# Patient Record
Sex: Female | Born: 1955 | Race: White | Hispanic: No | Marital: Married | Smoking: Former smoker
Health system: Southern US, Community
[De-identification: ages and names within clinical notes are randomized; demographics above are authoritative.]

## PROBLEM LIST (undated history)

## (undated) DIAGNOSIS — M1990 Unspecified osteoarthritis, unspecified site: Secondary | ICD-10-CM

## (undated) DIAGNOSIS — E785 Hyperlipidemia, unspecified: Secondary | ICD-10-CM

## (undated) DIAGNOSIS — K219 Gastro-esophageal reflux disease without esophagitis: Secondary | ICD-10-CM

## (undated) DIAGNOSIS — G709 Myoneural disorder, unspecified: Secondary | ICD-10-CM

## (undated) DIAGNOSIS — E559 Vitamin D deficiency, unspecified: Secondary | ICD-10-CM

## (undated) DIAGNOSIS — B079 Viral wart, unspecified: Secondary | ICD-10-CM

## (undated) HISTORY — DX: Vitamin D deficiency, unspecified: E55.9

## (undated) HISTORY — DX: Viral wart, unspecified: B07.9

## (undated) HISTORY — DX: Unspecified osteoarthritis, unspecified site: M19.90

## (undated) HISTORY — DX: Gastro-esophageal reflux disease without esophagitis: K21.9

## (undated) HISTORY — DX: Myoneural disorder, unspecified: G70.9

## (undated) HISTORY — DX: Hyperlipidemia, unspecified: E78.5

## (undated) HISTORY — PX: SHOULDER SURGERY: SHX246

---
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
## 1988-02-13 HISTORY — PX: CHOLECYSTECTOMY: SHX55

## 1988-02-13 HISTORY — PX: APPENDECTOMY: SHX54

## 2008-12-04 ENCOUNTER — Ambulatory Visit: Payer: Self-pay | Admitting: Pulmonary Disease

## 2008-12-04 DIAGNOSIS — R059 Cough, unspecified: Secondary | ICD-10-CM | POA: Insufficient documentation

## 2008-12-04 DIAGNOSIS — R05 Cough: Secondary | ICD-10-CM

## 2013-02-26 ENCOUNTER — Encounter: Payer: Self-pay | Admitting: Family Medicine

## 2013-02-26 DIAGNOSIS — M1711 Unilateral primary osteoarthritis, right knee: Secondary | ICD-10-CM | POA: Insufficient documentation

## 2013-02-26 DIAGNOSIS — E559 Vitamin D deficiency, unspecified: Secondary | ICD-10-CM | POA: Insufficient documentation

## 2013-02-26 DIAGNOSIS — E785 Hyperlipidemia, unspecified: Secondary | ICD-10-CM | POA: Insufficient documentation

## 2013-03-25 ENCOUNTER — Encounter: Payer: Self-pay | Admitting: Family Medicine

## 2013-04-20 ENCOUNTER — Encounter: Payer: Self-pay | Admitting: General Practice

## 2013-04-20 ENCOUNTER — Telehealth: Payer: Self-pay | Admitting: Family Medicine

## 2013-04-20 ENCOUNTER — Encounter: Payer: Self-pay | Admitting: *Deleted

## 2013-04-20 ENCOUNTER — Ambulatory Visit (INDEPENDENT_AMBULATORY_CARE_PROVIDER_SITE_OTHER): Payer: BC Managed Care – PPO | Admitting: General Practice

## 2013-04-20 VITALS — BP 114/67 | HR 69 | Temp 97.0°F | Ht 68.0 in | Wt 174.0 lb

## 2013-04-20 DIAGNOSIS — R350 Frequency of micturition: Secondary | ICD-10-CM

## 2013-04-20 DIAGNOSIS — N390 Urinary tract infection, site not specified: Secondary | ICD-10-CM

## 2013-04-20 LAB — POCT UA - MICROSCOPIC ONLY: WBC, Ur, HPF, POC: NEGATIVE

## 2013-04-20 LAB — POCT URINALYSIS DIPSTICK
Bilirubin, UA: NEGATIVE
Glucose, UA: NEGATIVE
Ketones, UA: NEGATIVE
Spec Grav, UA: 1.02
Urobilinogen, UA: NEGATIVE

## 2013-04-20 MED ORDER — CIPROFLOXACIN HCL 500 MG PO TABS: 500.0000 mg | ORAL_TABLET | Freq: Two times a day (BID) | ORAL | Status: DC

## 2013-04-20 NOTE — Progress Notes (Signed)
  Subjective:    Patient ID: Lori Livingston, female    DOB: [birth year], 57 y.o.   MRN: [id number]  HPI Presents today with frequency, urgency of urination. Denies burning with urination. Reports low back pain. OTC medications not taken.     Review of Systems  Constitutional: Negative for fever and chills.  Respiratory: Negative for chest tightness and shortness of breath.   Cardiovascular: Negative for chest pain.  Genitourinary: Negative for dysuria, hematuria and difficulty urinating.  Musculoskeletal: Positive for back pain.  Neurological: Negative for dizziness and headaches.  Psychiatric/Behavioral: Negative.        Objective:   Physical Exam  Constitutional: She is oriented to person, place, and time. She appears well-developed and well-nourished.  Cardiovascular: Normal rate, regular rhythm and normal heart sounds.   Pulmonary/Chest: Effort normal and breath sounds normal. No respiratory distress. She exhibits no tenderness.  Neurological: She is alert and oriented to person, place, and time.  Skin: Skin is warm and dry.  Psychiatric: She has a normal mood and affect.   Results for orders placed in visit on 04/20/13  POCT URINALYSIS DIPSTICK      Result Value Range   Color, UA yellow     Clarity, UA clear     Glucose, UA neg     Bilirubin, UA neg     Ketones, UA neg     Spec Grav, UA 1.020     Blood, UA trace     pH, UA 6.5     Protein, UA trace     Urobilinogen, UA negative     Nitrite, UA neg     Leukocytes, UA Negative    POCT UA - MICROSCOPIC ONLY      Result Value Range   WBC, Ur, HPF, POC neg     RBC, urine, microscopic 5-10     Bacteria, U Microscopic mod     Mucus, UA mod     Epithelial cells, urine per micros few     Crystals, Ur, HPF, POC neg     Casts, Ur, LPF, POC neg     Yeast, UA neg           Assessment & Plan:  Take antibiotics until course completed Increase fluid intake Proper perineal care Increase fluid intake AZO over the  counter X2 days Frequent voiding Proper perineal hygiene Culture pending Patient verbalized understanding Coralie Keens, FNP-C

## 2013-04-20 NOTE — Telephone Encounter (Signed)
APPT GIVEN FOR TONIGHT

## 2013-04-20 NOTE — Patient Instructions (Signed)
Urinary Tract Infection Urinary tract infections (UTIs) can develop anywhere along your urinary tract. Your urinary tract is your body's drainage system for removing wastes and extra water. Your urinary tract includes two kidneys, two ureters, a bladder, and a urethra. Your kidneys are a pair of bean-shaped organs. Each kidney is about the size of your fist. They are located below your ribs, one on each side of your spine. CAUSES Infections are caused by microbes, which are microscopic organisms, including fungi, viruses, and bacteria. These organisms are so small that they can only be seen through a microscope. Bacteria are the microbes that most commonly cause UTIs. SYMPTOMS  Symptoms of UTIs may vary by age and gender of the patient and by the location of the infection. Symptoms in young women typically include a frequent and intense urge to urinate and a painful, burning feeling in the bladder or urethra during urination. Older women and men are more likely to be tired, shaky, and weak and have muscle aches and abdominal pain. A fever may mean the infection is in your kidneys. Other symptoms of a kidney infection include pain in your back or sides below the ribs, nausea, and vomiting. DIAGNOSIS To diagnose a UTI, your caregiver will ask you about your symptoms. Your caregiver also will ask to provide a urine sample. The urine sample will be tested for bacteria and white blood cells. White blood cells are made by your body to help fight infection. TREATMENT  Typically, UTIs can be treated with medication. Because most UTIs are caused by a bacterial infection, they usually can be treated with the use of antibiotics. The choice of antibiotic and length of treatment depend on your symptoms and the type of bacteria causing your infection. HOME CARE INSTRUCTIONS  If you were prescribed antibiotics, take them exactly as your caregiver instructs you. Finish the medication even if you feel better after you  have only taken some of the medication.  Drink enough water and fluids to keep your urine clear or pale yellow.  Avoid caffeine, tea, and carbonated beverages. They tend to irritate your bladder.  Empty your bladder often. Avoid holding urine for long periods of time.  Empty your bladder before and after sexual intercourse.  After a bowel movement, women should cleanse from front to back. Use each tissue only once. SEEK MEDICAL CARE IF:   You have back pain.  You develop a fever.  Your symptoms do not begin to resolve within 3 days. SEEK IMMEDIATE MEDICAL CARE IF:   You have severe back pain or lower abdominal pain.  You develop chills.  You have nausea or vomiting.  You have continued burning or discomfort with urination. MAKE SURE YOU:   Understand these instructions.  Will watch your condition.  Will get help right away if you are not doing well or get worse. Document Released: 09/10/2005 Document Revised: 06/01/2012 Document Reviewed: 01/09/2012 [employer] Patient Information 2013 [employer].  

## 2013-05-27 ENCOUNTER — Ambulatory Visit: Payer: Self-pay | Admitting: Family Medicine

## 2013-06-23 ENCOUNTER — Ambulatory Visit: Payer: Self-pay | Admitting: Family Medicine

## 2013-07-06 ENCOUNTER — Ambulatory Visit (INDEPENDENT_AMBULATORY_CARE_PROVIDER_SITE_OTHER): Payer: BC Managed Care – PPO | Admitting: Family Medicine

## 2013-07-06 ENCOUNTER — Encounter: Payer: Self-pay | Admitting: Family Medicine

## 2013-07-06 VITALS — BP 131/76 | HR 75 | Temp 97.5°F | Ht 67.5 in | Wt 172.0 lb

## 2013-07-06 DIAGNOSIS — M549 Dorsalgia, unspecified: Secondary | ICD-10-CM | POA: Insufficient documentation

## 2013-07-06 MED ORDER — TRAMADOL HCL 50 MG PO TABS: 50.0000 mg | ORAL_TABLET | Freq: Three times a day (TID) | ORAL | Status: DC | PRN

## 2013-07-06 MED ORDER — DICLOFENAC SODIUM 1 % TD GEL: 2.0000 g | Freq: Four times a day (QID) | TRANSDERMAL | Status: DC | PRN

## 2013-07-06 MED ORDER — CYCLOBENZAPRINE HCL 5 MG PO TABS: 5.0000 mg | ORAL_TABLET | Freq: Three times a day (TID) | ORAL | Status: DC | PRN

## 2013-07-06 NOTE — Progress Notes (Signed)
  Subjective:    Patient ID: Lori Livingston, female    DOB: 04/09/1956, 57 y.o.   MRN: [id number]  Back Pain Pertinent negatives include no abdominal pain, chest pain, dysuria or fever.   Mild back pain started after working in garden. "felt a twinge on L". Pain became considerably worse after getting up from comode at restaruant later that night. Pain now located on R lower back. Heat, Ice, and tylenol w/o much benefit. Wakes up at night. Denies radiation. Worse w/ certain movements. Denies any loss of bowel or bladder function or change in sensation. Denies any h/o prevoius trauma.  Meloxiam 15mg  daily w/ some relief. Takes this for Osteoarthritis.    Review of Systems  Constitutional: Negative for fever and activity change.  Respiratory: Negative for shortness of breath.   Cardiovascular: Negative for chest pain and palpitations.  Gastrointestinal: Negative for abdominal pain and abdominal distention.  Genitourinary: Negative for dysuria, frequency and flank pain.  Musculoskeletal: Positive for back pain.  All other systems reviewed and are negative.       Objective:   Physical Exam  Vitals reviewed. Constitutional: She is oriented to person, place, and time. She appears well-developed and well-nourished. No distress.  HENT:  Head: Normocephalic and atraumatic.  Eyes: Conjunctivae are normal. Pupils are equal, round, and reactive to light.  Neck: Normal range of motion. Neck supple.  Cardiovascular: Normal rate, normal heart sounds and intact distal pulses.   No murmur heard. Pulmonary/Chest: Effort normal and breath sounds normal.  Abdominal: Soft. She exhibits no distension.  Musculoskeletal:  ROM of hip limited secondary to pain. Straight let raise of R leg to 75 degrees, L leg to 90.  FABERs positive on R. 4/5 strength bilat on leg lift. Painful to palpation of lumbar spine in the R perispinal regionand along the superior SI joint area. No point tenderness and no swelling or  ecchymosis.   Neurological: She is alert and oriented to person, place, and time.  Skin: Skin is warm and dry. No rash noted. She is not diaphoretic. No erythema.  Psychiatric: She has a normal mood and affect. Her behavior is normal.  BP 131/76  Pulse 75  Temp(Src) 97.5 F (36.4 C) (Oral)  Ht 5' 7.5" (1.715 m)  Wt 172 lb (78.019 kg)  BMI 26.53 kg/m2         Assessment & Plan:  57 yo female w/ lumbar strain and SI joint irritation. - cont Meloxicam - continue heat therapy - start flexeril - start tramadol PRN - start voltaren gel  - exercises demonstrated - work note proveded.  - precautions given and all questions answered  Shelly Flatten, MD Family Medicine PGY-3 07/06/2013, 11:53 AM

## 2013-07-06 NOTE — Assessment & Plan Note (Signed)
lumbar strain and SI joint irritation. - cont Meloxicam - continue heat therapy - start flexeril - start tramadol PRN - start voltaren gel  - exercises demonstrated - work note proveded.  - precautions given and all questions answered

## 2013-07-06 NOTE — Patient Instructions (Addendum)
Thank you for coming in today You are experiencing lumbar strain with muscle spasm and irritation to your sacroiliac joint Please continue taking the meloxicam Start applying heat only to your back (no more cold) Rest when able but stay active, jut try not to over due it Please start using the flexeril and voltaren gel for relief Use the tramadol only as needed after the other therapies have been attempted This should resolve with in a few days to weeks Have a great day. Follow up in our office as needed.  Muscle Cramps Muscle cramps are due to sudden involuntary muscle contraction. This means you have no control over the tightening of a muscle (or muscles). Often there are no obvious causes. Muscle cramps may occur with overexertion. They may also occur with chilling of the muscles. An example of a muscle chilling activity is swimming. It is uncommon for cramps to be due to a serious underlying disorder. In most cases, muscle cramps improve (or leave) within minutes. CAUSES  Some common causes are:  Injury.  Infections, especially viral.  Abnormal levels of the salts and ions in your blood (electrolytes). This could happen if you are taking water pills (diuretics).  Blood vessel disease where not enough blood is getting to the muscles (intermittent claudication). Some uncommon causes are:  Side effects of some medicine (such as lithium).  Alcohol abuse.  Diseases where there is soreness (inflammation) of the muscular system. HOME CARE INSTRUCTIONS   It may be helpful to massage, stretch, and relax the affected muscle.  Taking a dose of over-the-counter diphenhydramine is helpful for night leg cramps. SEEK MEDICAL CARE IF:  Cramps are frequent and not relieved with medicine. MAKE SURE YOU:   Understand these instructions.  Will watch your condition.  Will get help right away if you are not doing well or get worse. Document Released: 05/23/2002 Document Revised: 02/23/2012  Document Reviewed: 11/22/2008 [facility] Patient Information 2014 [place], [place].

## 2013-08-11 ENCOUNTER — Ambulatory Visit: Payer: Self-pay | Admitting: Family Medicine

## 2013-09-02 ENCOUNTER — Encounter: Payer: Self-pay | Admitting: Family Medicine

## 2013-09-02 ENCOUNTER — Ambulatory Visit (INDEPENDENT_AMBULATORY_CARE_PROVIDER_SITE_OTHER): Payer: BC Managed Care – PPO | Admitting: Family Medicine

## 2013-09-02 VITALS — BP 123/66 | HR 62 | Temp 97.0°F | Ht 67.0 in | Wt 175.8 lb

## 2013-09-02 DIAGNOSIS — M1711 Unilateral primary osteoarthritis, right knee: Secondary | ICD-10-CM

## 2013-09-02 DIAGNOSIS — E559 Vitamin D deficiency, unspecified: Secondary | ICD-10-CM

## 2013-09-02 DIAGNOSIS — E785 Hyperlipidemia, unspecified: Secondary | ICD-10-CM

## 2013-09-02 DIAGNOSIS — M1710 Unilateral primary osteoarthritis, unspecified knee: Secondary | ICD-10-CM

## 2013-09-02 MED ORDER — PRAVASTATIN SODIUM 40 MG PO TABS: 40.0000 mg | ORAL_TABLET | Freq: Every day | ORAL | Status: DC

## 2013-09-02 NOTE — Patient Instructions (Addendum)
      Dr Jimmie Dattilio's Recommendations  For nutrition information, I recommend books:  1).Eat to Live by Dr Joel Fuhrman. 2).Prevent and Reverse Heart Disease by Dr Caldwell Esselstyn. 3) Dr Neal Barnard's Book:  Program to Reverse Diabetes  Exercise recommendations are:  If unable to walk, then the patient can exercise in a chair 3 times a day. By flapping arms like a bird gently and raising legs outwards to the front.  If ambulatory, the patient can go for walks for 30 minutes 3 times a week. Then increase the intensity and duration as tolerated.  Goal is to try to attain exercise frequency to 5 times a week.  If applicable: Best to perform resistance exercises (machines or weights) 2 days a week and cardio type exercises 3 days per week.  

## 2013-09-02 NOTE — Progress Notes (Signed)
Patient ID: Lori Livingston, female   DOB: May 03, 1956, 57 y.o.   MRN: [id number] SUBJECTIVE: CC: Chief Complaint  Patient presents with  . Follow-up    3 month discuss labs  wants written rx for pravastatain    HPI: Brings her lab results from [facility].   Patient is here for follow up of hyperlipidemia/vit D Def: denies Headache;denies Chest Pain;denies weakness;denies Shortness of Breath and orthopnea;denies Visual changes;denies palpitations;denies cough;denies pedal edema;denies symptoms of TIA or stroke;deniesClaudication symptoms. admits to Compliance with medications; denies Problems with medications.  Right knee arthritis ongoing.  Dietary changes have been modest.  Past Medical History  Diagnosis Date  . Neuromuscular disorder     Right knee meniscus tear  . Arthritis     Right knee  . Hyperlipidemia   . Warts   . Vitamin D deficiency    Past Surgical History  Procedure Laterality Date  . Tonsillectomy  1964  . Cholecystectomy  02/1988  . Appendectomy  02/1988   History   Social History  . Marital Status: Married    Spouse Name: N/A    Number of Children: N/A  . Years of Education: N/A   Occupational History  . Not on file.   Social History Main Topics  . Smoking status: Former Smoker    Types: Cigarettes    Quit date: 07/06/1978  . Smokeless tobacco: Never Used  . Alcohol Use: No  . Drug Use: No  . Sexual Activity: Not on file   Other Topics Concern  . Not on file   Social History Narrative  . No narrative on file   Family History  Problem Relation Age of Onset  . Heart disease Mother   . Diabetes Mother   . Heart attack Mother 33  . Stroke Father   . Cancer Brother     melanoma   Current Outpatient Prescriptions on File Prior to Visit  Medication Sig Dispense Refill  . Coenzyme Q10 200 MG capsule Take 200 mg by mouth daily.      . Cyanocobalamin (VITAMIN B-12 PO) Take by mouth.      . diclofenac sodium (VOLTAREN) 1 % GEL Apply 2 g  topically 4 (four) times daily as needed.  100 g  1  . Ergocalciferol (VITAMIN D2) 2000 UNITS TABS Take 2,000 Int'l Units by mouth daily.      Marland Kitchen estradiol (ESTRACE) 1 MG tablet Take 1 mg by mouth daily.      . meloxicam (MOBIC) 15 MG tablet Take 15 mg by mouth daily.      . Multiple Vitamin (MULTIVITAMIN) tablet Take 1 tablet by mouth daily.       No current facility-administered medications on file prior to visit.   Allergies  Allergen Reactions  . Celebrex [Celecoxib]   . Indomethacin   . Propoxyphene-Acetaminophen    Immunization History  Administered Date(s) Administered  . Influenza Whole 09/28/2012   Prior to Admission medications   Medication Sig Start Date End Date Taking? Authorizing Provider  Coenzyme Q10 200 MG capsule Take 200 mg by mouth daily.   Yes Historical Provider, MD  Cyanocobalamin (VITAMIN B-12 PO) Take by mouth.   Yes Historical Provider, MD  diclofenac sodium (VOLTAREN) 1 % GEL Apply 2 g topically 4 (four) times daily as needed. 07/06/13  Yes Ozella Rocks, MD  Ergocalciferol (VITAMIN D2) 2000 UNITS TABS Take 2,000 Int'l Units by mouth daily.   Yes Historical Provider, MD  estradiol (ESTRACE) 1 MG tablet Take 1  mg by mouth daily.   Yes Historical Provider, MD  meloxicam (MOBIC) 15 MG tablet Take 15 mg by mouth daily.   Yes Historical Provider, MD  Multiple Vitamin (MULTIVITAMIN) tablet Take 1 tablet by mouth daily.   Yes Historical Provider, MD  pravastatin (PRAVACHOL) 40 MG tablet Take 1 tablet (40 mg total) by mouth daily. 09/02/13  Yes Ileana Ladd, MD     ROS: As above in the HPI. All other systems are stable or negative.  OBJECTIVE: APPEARANCE:  Patient in no acute distress.The patient appeared well nourished and normally developed. Acyanotic. Waist: VITAL SIGNS:BP 123/66  Pulse 62  Temp(Src) 97 F (36.1 C) (Oral)  Ht 5\' 7"  (1.702 m)  Wt 175 lb 12.8 oz (79.742 kg)  BMI 27.53 kg/m2 WF  SKIN: warm and  Dry without overt rashes, tattoos and  scars  HEAD and Neck: without JVD, Head and scalp: normal Eyes:No scleral icterus. Fundi normal, eye movements normal. Ears: Auricle normal, canal normal, Tympanic membranes normal, insufflation normal. Nose: normal Throat: normal Neck & thyroid: normal  CHEST & LUNGS: Chest wall: normal Lungs: Clear  CVS: Reveals the PMI to be normally located. Regular rhythm, First and Second Heart sounds are normal,  absence of murmurs, rubs or gallops. Peripheral vasculature: Radial pulses: normal Dorsal pedis pulses: normal Posterior pulses: normal  ABDOMEN:  Appearance: normal Benign, no organomegaly, no masses, no Abdominal Aortic enlargement. No Guarding , no rebound. No Bruits. Bowel sounds: normal  RECTAL: N/A GU: N/A  EXTREMETIES: nonedematous.  MUSCULOSKELETAL:  Spine: normal Joints: right knee crepitus.  NEUROLOGIC: oriented to time,place and person; nonfocal. Strength is normal Sensory is normal Reflexes are normal Cranial Nerves are normal.  ASSESSMENT: Other and unspecified hyperlipidemia - Plan: pravastatin (PRAVACHOL) 40 MG tablet  Arthritis of knee, right  Unspecified vitamin D deficiency   PLAN: Labs reviewed and to be scanned into EPIC. Results are acceptable. But could be a little better and hit exact target with aggressive diet.  Meds ordered this encounter  Medications  . pravastatin (PRAVACHOL) 40 MG tablet    Sig: Take 1 tablet (40 mg total) by mouth daily.    Dispense:  90 tablet    Refill:  3    Same regimen.       Dr Woodroe Mode Recommendations  For nutrition information, I recommend books:  1).Eat to Live by Dr Monico Hoar. 2).Prevent and Reverse Heart Disease by Dr Suzzette Righter. 3) Dr Katherina Right Book:  Program to Reverse Diabetes  Exercise recommendations are:  If unable to walk, then the patient can exercise in a chair 3 times a day. By flapping arms like a bird gently and raising legs outwards to the  front.  If ambulatory, the patient can go for walks for 30 minutes 3 times a week. Then increase the intensity and duration as tolerated.  Goal is to try to attain exercise frequency to 5 times a week.  If applicable: Best to perform resistance exercises (machines or weights) 2 days a week and cardio type exercises 3 days per week.  Return in about 6 months (around 03/02/2014) for Recheck medical problems.  Lockie Bothun P. [place] Charon, M.D.

## 2014-02-11 ENCOUNTER — Other Ambulatory Visit: Payer: Self-pay | Admitting: Nurse Practitioner

## 2014-02-14 NOTE — Telephone Encounter (Signed)
Last seen 09/02/13  [facility]  No lipids in EPIC

## 2014-02-14 NOTE — Telephone Encounter (Signed)
Patient needs to be seen. Has exceeded time since last visit. Limited quantity refilled. Needs to bring all medications to next appointment.   

## 2014-04-11 ENCOUNTER — Other Ambulatory Visit: Payer: Self-pay | Admitting: Family Medicine

## 2014-04-12 NOTE — Telephone Encounter (Signed)
No lipids in Epic. Past due for labs. If you approve print rx for pt to pickup.

## 2014-04-12 NOTE — Telephone Encounter (Signed)
Patient needs to be seen. Patient has exceeded limit since last visit. Refill denied. Bring all medications at next office visit. 

## 2014-04-17 ENCOUNTER — Ambulatory Visit (INDEPENDENT_AMBULATORY_CARE_PROVIDER_SITE_OTHER): Payer: BC Managed Care – PPO | Admitting: Family

## 2014-04-17 ENCOUNTER — Encounter: Payer: Self-pay | Admitting: Family

## 2014-04-17 VITALS — BP 122/67 | HR 91 | Temp 97.5°F | Ht 68.0 in | Wt 180.0 lb

## 2014-04-17 DIAGNOSIS — J019 Acute sinusitis, unspecified: Secondary | ICD-10-CM

## 2014-04-17 MED ORDER — AMOXICILLIN-POT CLAVULANATE 875-125 MG PO TABS: 1.0000 | ORAL_TABLET | Freq: Two times a day (BID) | ORAL | Status: DC

## 2014-04-17 MED ORDER — BENZONATATE 200 MG PO CAPS: 200.0000 mg | ORAL_CAPSULE | Freq: Three times a day (TID) | ORAL | Status: DC | PRN

## 2014-04-17 NOTE — Progress Notes (Signed)
Subjective:    Patient ID: Lori Livingston, female    DOB: July 02, 1956, 58 y.o.   MRN: [id number]  Cough This is a new problem. The current episode started 1 to 4 weeks ago. The problem has been gradually worsening. The problem occurs every few minutes. The cough is productive of purulent sputum. Associated symptoms include ear congestion, headaches, nasal congestion, postnasal drip, rhinorrhea, a sore throat and shortness of breath. Pertinent negatives include no chills, ear pain, fever or wheezing. The symptoms are aggravated by lying down. She has tried body position changes (Mucinex) for the symptoms. The treatment provided mild relief. There is no history of asthma or COPD.      Review of Systems  Constitutional: Negative for fever and chills.  HENT: Positive for postnasal drip, rhinorrhea and sore throat. Negative for ear pain.   Respiratory: Positive for cough and shortness of breath. Negative for wheezing.   Neurological: Positive for headaches.  All other systems reviewed and are negative.      Objective:   Physical Exam  Vitals reviewed. Constitutional: She is oriented to person, place, and time. She appears well-developed and well-nourished. No distress.  HENT:  Head: Normocephalic and atraumatic.  Right Ear: External ear normal.  Left Ear: External ear normal.  Mouth/Throat: No oropharyngeal exudate.  Nasal passage erythemas with mild swelling    Eyes: Pupils are equal, round, and reactive to light.  Neck: Normal range of motion. Neck supple. No thyromegaly present.  Cardiovascular: Normal rate, regular rhythm, normal heart sounds and intact distal pulses.   No murmur heard. Pulmonary/Chest: Effort normal and breath sounds normal. No respiratory distress. She has no wheezes.  Dry non-productive cough   Abdominal: Soft. Bowel sounds are normal. She exhibits no distension. There is no tenderness.  Musculoskeletal: Normal range of motion. She exhibits no edema and no  tenderness.  Neurological: She is alert and oriented to person, place, and time.  Skin: Skin is warm and dry.  Psychiatric: She has a normal mood and affect. Her behavior is normal. Judgment and thought content normal.   BP 122/67  Pulse 91  Temp(Src) 97.5 F (36.4 C) (Oral)  Ht 5\' 8"  (1.727 m)  Wt 180 lb (81.647 kg)  BMI 27.38 kg/m2        Assessment & Plan:  1. Sinusitis, acute - Take meds as prescribed - Use a cool mist humidifier  -Use saline nose sprays frequently -Saline irrigations of the nose can be very helpful if done frequently.  * 4X daily for 1 week*  * Use of a nettie pot can be helpful with this. Follow directions with this* -Force fluids -For any cough or congestion  Use plain Mucinex- regular strength or max strength is fine   * Children- consult with Pharmacist for dosing -For fever or aces or pains- take tylenol or ibuprofen appropriate for age and weight.  * for fevers greater than 101 orally you may alternate ibuprofen and tylenol every  3 hours. -Throat lozenges if help -Daily Claritin for next several months  Meds ordered this encounter  Medications  . amoxicillin-clavulanate (AUGMENTIN) 875-125 MG per tablet    Sig: Take 1 tablet by mouth 2 (two) times daily.    Dispense:  10 tablet    Refill:  0    Order Specific Question:  Supervising Provider    Answer:  Chipper Herb [[id number]]  . benzonatate (TESSALON) 200 MG capsule    Sig: Take 1 capsule (200 mg total) by  mouth 3 (three) times daily as needed for cough.    Dispense:  20 capsule    Refill:  0    Order Specific Question:  Supervising Provider    Answer:  Chipper Herb [[id number]]     Evelina Dun, FNP

## 2014-04-17 NOTE — Patient Instructions (Signed)
Sinusitis Sinusitis is redness, soreness, and swelling (inflammation) of the paranasal sinuses. Paranasal sinuses are air pockets within the bones of your face (beneath the eyes, the middle of the forehead, or above the eyes). In healthy paranasal sinuses, mucus is able to drain out, and air is able to circulate through them by way of your nose. However, when your paranasal sinuses are inflamed, mucus and air can become trapped. This can allow bacteria and other germs to grow and cause infection. Sinusitis can develop quickly and last only a short time (acute) or continue over a long period (chronic). Sinusitis that lasts for more than 12 weeks is considered chronic.  CAUSES  Causes of sinusitis include:  Allergies.  Structural abnormalities, such as displacement of the cartilage that separates your nostrils (deviated septum), which can decrease the air flow through your nose and sinuses and affect sinus drainage.  Functional abnormalities, such as when the small hairs (cilia) that line your sinuses and help remove mucus do not work properly or are not present. SYMPTOMS  Symptoms of acute and chronic sinusitis are the same. The primary symptoms are pain and pressure around the affected sinuses. Other symptoms include:  Upper toothache.  Earache.  Headache.  Bad breath.  Decreased sense of smell and taste.  A cough, which worsens when you are lying flat.  Fatigue.  Fever.  Thick drainage from your nose, which often is green and may contain pus (purulent).  Swelling and warmth over the affected sinuses. DIAGNOSIS  Your caregiver will perform a physical exam. During the exam, your caregiver may:  Look in your nose for signs of abnormal growths in your nostrils (nasal polyps).  Tap over the affected sinus to check for signs of infection.  View the inside of your sinuses (endoscopy) with a special imaging device with a light attached (endoscope), which is inserted into your  sinuses. If your caregiver suspects that you have chronic sinusitis, one or more of the following tests may be recommended:  Allergy tests.  Nasal culture A sample of mucus is taken from your nose and sent to a lab and screened for bacteria.  Nasal cytology A sample of mucus is taken from your nose and examined by your caregiver to determine if your sinusitis is related to an allergy. TREATMENT  Most cases of acute sinusitis are related to a viral infection and will resolve on their own within 10 days. Sometimes medicines are prescribed to help relieve symptoms (pain medicine, decongestants, nasal steroid sprays, or saline sprays).  However, for sinusitis related to a bacterial infection, your caregiver will prescribe antibiotic medicines. These are medicines that will help kill the bacteria causing the infection.  Rarely, sinusitis is caused by a fungal infection. In theses cases, your caregiver will prescribe antifungal medicine. For some cases of chronic sinusitis, surgery is needed. Generally, these are cases in which sinusitis recurs more than 3 times per year, despite other treatments. HOME CARE INSTRUCTIONS   Drink plenty of water. Water helps thin the mucus so your sinuses can drain more easily.  Use a humidifier.  Inhale steam 3 to 4 times a day (for example, sit in the bathroom with the shower running).  Apply a warm, moist washcloth to your face 3 to 4 times a day, or as directed by your caregiver.  Use saline nasal sprays to help moisten and clean your sinuses.  Take over-the-counter or prescription medicines for pain, discomfort, or fever only as directed by your caregiver. SEEK IMMEDIATE MEDICAL   CARE IF:  You have increasing pain or severe headaches.  You have nausea, vomiting, or drowsiness.  You have swelling around your face.  You have vision problems.  You have a stiff neck.  You have difficulty breathing. MAKE SURE YOU:   Understand these  instructions.  Will watch your condition.  Will get help right away if you are not doing well or get worse. Document Released: 12/01/2005 Document Revised: 02/23/2012 Document Reviewed: 12/16/2011 [employer] Patient Information 2014 [place], [place].  - Take meds as prescribed - Use a cool mist humidifier  -Use saline nose sprays frequently -Saline irrigations of the nose can be very helpful if done frequently.  * 4X daily for 1 week*  * Use of a nettie pot can be helpful with this. Follow directions with this* -Force fluids -For any cough or congestion  Use plain Mucinex- regular strength or max strength is fine   * Children- consult with Pharmacist for dosing -For fever or aces or pains- take tylenol or ibuprofen appropriate for age and weight.  * for fevers greater than 101 orally you may alternate ibuprofen and tylenol every  3 hours. -Throat lozenges if help -Daily Claritin for next several months   Evelina Dun, FNP

## 2014-06-12 ENCOUNTER — Ambulatory Visit (INDEPENDENT_AMBULATORY_CARE_PROVIDER_SITE_OTHER): Payer: BC Managed Care – PPO | Admitting: Family

## 2014-06-12 ENCOUNTER — Encounter: Payer: Self-pay | Admitting: Family

## 2014-06-12 VITALS — BP 124/69 | HR 65 | Temp 98.1°F | Ht 68.0 in | Wt 180.0 lb

## 2014-06-12 DIAGNOSIS — M1710 Unilateral primary osteoarthritis, unspecified knee: Secondary | ICD-10-CM

## 2014-06-12 DIAGNOSIS — M1711 Unilateral primary osteoarthritis, right knee: Secondary | ICD-10-CM

## 2014-06-12 DIAGNOSIS — E559 Vitamin D deficiency, unspecified: Secondary | ICD-10-CM

## 2014-06-12 DIAGNOSIS — E785 Hyperlipidemia, unspecified: Secondary | ICD-10-CM

## 2014-06-12 DIAGNOSIS — IMO0002 Reserved for concepts with insufficient information to code with codable children: Secondary | ICD-10-CM

## 2014-06-12 MED ORDER — PRAVASTATIN SODIUM 40 MG PO TABS: ORAL_TABLET | ORAL | Status: DC

## 2014-06-12 NOTE — Patient Instructions (Signed)
Health Maintenance, Female A healthy lifestyle and preventative care can promote health and wellness.  Maintain regular health, dental, and eye exams.  Eat a healthy diet. Foods like vegetables, fruits, whole grains, low-fat dairy products, and lean protein foods contain the nutrients you need without too many calories. Decrease your intake of foods high in solid fats, added sugars, and salt. Get information about a proper diet from your caregiver, if necessary.  Regular physical exercise is one of the most important things you can do for your health. Most adults should get at least 150 minutes of moderate-intensity exercise (any activity that increases your heart rate and causes you to sweat) each week. In addition, most adults need muscle-strengthening exercises on 2 or more days a week.   Maintain a healthy weight. The body mass index (BMI) is a screening tool to identify possible weight problems. It provides an estimate of body fat based on height and weight. Your caregiver can help determine your BMI, and can help you achieve or maintain a healthy weight. For adults 20 years and older:  A BMI below 18.5 is considered underweight.  A BMI of 18.5 to 24.9 is normal.  A BMI of 25 to 29.9 is considered overweight.  A BMI of 30 and above is considered obese.  Maintain normal blood lipids and cholesterol by exercising and minimizing your intake of saturated fat. Eat a balanced diet with plenty of fruits and vegetables. Blood tests for lipids and cholesterol should begin at age 55 and be repeated every 5 years. If your lipid or cholesterol levels are high, you are over 50, or you are a high risk for heart disease, you may need your cholesterol levels checked more frequently.Ongoing high lipid and cholesterol levels should be treated with medicines if diet and exercise are not effective.  If you smoke, find out from your caregiver how to quit. If you do not use tobacco, do not start.  Lung  cancer screening is recommended for adults aged 79-80 years who are at high risk for developing lung cancer because of a history of smoking. Yearly low-dose computed tomography (CT) is recommended for people who have at least a 30-pack-year history of smoking and are a current smoker or have quit within the past 15 years. A pack year of smoking is smoking an average of 1 pack of cigarettes a day for 1 year (for example: 1 pack a day for 30 years or 2 packs a day for 15 years). Yearly screening should continue until the smoker has stopped smoking for at least 15 years. Yearly screening should also be stopped for people who develop a health problem that would prevent them from having lung cancer treatment.  If you are pregnant, do not drink alcohol. If you are breastfeeding, be very cautious about drinking alcohol. If you are not pregnant and choose to drink alcohol, do not exceed 1 drink per day. One drink is considered to be 12 ounces (355 mL) of beer, 5 ounces (148 mL) of wine, or 1.5 ounces (44 mL) of liquor.  Avoid use of street drugs. Do not share needles with anyone. Ask for help if you need support or instructions about stopping the use of drugs.  High blood pressure causes heart disease and increases the risk of stroke. Blood pressure should be checked at least every 1 to 2 years. Ongoing high blood pressure should be treated with medicines, if weight loss and exercise are not effective.  If you are 55 to 58  years old, ask your caregiver if you should take aspirin to prevent strokes.  Diabetes screening involves taking a blood sample to check your fasting blood sugar level. This should be done once every 3 years, after age 8, if you are within normal weight and without risk factors for diabetes. Testing should be considered at a younger age or be carried out more frequently if you are overweight and have at least 1 risk factor for diabetes.  Breast cancer screening is essential preventative care  for women. You should practice "breast self-awareness." This means understanding the normal appearance and feel of your breasts and may include breast self-examination. Any changes detected, no matter how small, should be reported to a caregiver. Women in their 15s and 30s should have a clinical breast exam (CBE) by a caregiver as part of a regular health exam every 1 to 3 years. After age 43, women should have a CBE every year. Starting at age 55, women should consider having a mammogram (breast X-ray) every year. Women who have a family history of breast cancer should talk to their caregiver about genetic screening. Women at a high risk of breast cancer should talk to their caregiver about having an MRI and a mammogram every year.  Breast cancer gene (BRCA)-related cancer risk assessment is recommended for women who have family members with BRCA-related cancers. BRCA-related cancers include breast, ovarian, tubal, and peritoneal cancers. Having family members with these cancers may be associated with an increased risk for harmful changes (mutations) in the breast cancer genes BRCA1 and BRCA2. Results of the assessment will determine the need for genetic counseling and BRCA1 and BRCA2 testing.  The Pap test is a screening test for cervical cancer. Women should have a Pap test starting at age 58. Between ages 8 and 66, Pap tests should be repeated every 2 years. Beginning at age 58, you should have a Pap test every 3 years as long as the past 3 Pap tests have been normal. If you had a hysterectomy for a problem that was not cancer or a condition that could lead to cancer, then you no longer need Pap tests. If you are between ages 41 and 31, and you have had normal Pap tests going back 10 years, you no longer need Pap tests. If you have had past treatment for cervical cancer or a condition that could lead to cancer, you need Pap tests and screening for cancer for at least 20 years after your treatment. If Pap  tests have been discontinued, risk factors (such as a new sexual partner) need to be reassessed to determine if screening should be resumed. Some women have medical problems that increase the chance of getting cervical cancer. In these cases, your caregiver may recommend more frequent screening and Pap tests.  The human papillomavirus (HPV) test is an additional test that may be used for cervical cancer screening. The HPV test looks for the virus that can cause the cell changes on the cervix. The cells collected during the Pap test can be tested for HPV. The HPV test could be used to screen women aged 80 years and older, and should be used in women of any age who have unclear Pap test results. After the age of 90, women should have HPV testing at the same frequency as a Pap test.  Colorectal cancer can be detected and often prevented. Most routine colorectal cancer screening begins at the age of 64 and continues through age 13. However, your caregiver may  recommend screening at an earlier age if you have risk factors for colon cancer. On a yearly basis, your caregiver may provide home test kits to check for hidden blood in the stool. Use of a small camera at the end of a tube, to directly examine the colon (sigmoidoscopy or colonoscopy), can detect the earliest forms of colorectal cancer. Talk to your caregiver about this at age 43, when routine screening begins. Direct examination of the colon should be repeated every 5 to 10 years through age 17, unless early forms of pre-cancerous polyps or small growths are found.  Hepatitis C blood testing is recommended for all people born from [birth year] through 1965 and any individual with known risks for hepatitis C.  Practice safe sex. Use condoms and avoid high-risk sexual practices to reduce the spread of sexually transmitted infections (STIs). Sexually active women aged 16 and younger should be checked for Chlamydia, which is a common sexually transmitted infection.  Older women with new or multiple partners should also be tested for Chlamydia. Testing for other STIs is recommended if you are sexually active and at increased risk.  Osteoporosis is a disease in which the bones lose minerals and strength with aging. This can result in serious bone fractures. The risk of osteoporosis can be identified using a bone density scan. Women ages 46 and over and women at risk for fractures or osteoporosis should discuss screening with their caregivers. Ask your caregiver whether you should be taking a calcium supplement or vitamin D to reduce the rate of osteoporosis.  Menopause can be associated with physical symptoms and risks. Hormone replacement therapy is available to decrease symptoms and risks. You should talk to your caregiver about whether hormone replacement therapy is right for you.  Use sunscreen. Apply sunscreen liberally and repeatedly throughout the day. You should seek shade when your shadow is shorter than you. Protect yourself by wearing long sleeves, pants, a wide-brimmed hat, and sunglasses year round, whenever you are outdoors.  Notify your caregiver of new moles or changes in moles, especially if there is a change in shape or color. Also notify your caregiver if a mole is larger than the size of a pencil eraser.  Stay current with your immunizations. Document Released: 06/16/2011 Document Revised: 03/28/2013 Document Reviewed: 11/02/2013 [facility] Patient Information 2015 [place], [place]. This information is not intended to replace advice given to you by your health care provider. Make sure you discuss any questions you have with your health care provider.

## 2014-06-12 NOTE — Progress Notes (Signed)
   Subjective:    Patient ID: Lori Livingston, female    DOB: February 19, 1956, 58 y.o.   MRN: [id number]  Hyperlipidemia This is a chronic problem. The current episode started more than 1 year ago. The problem is controlled. Recent lipid tests were reviewed and are normal. Exacerbating diseases include hypothyroidism. She has no history of diabetes. Pertinent negatives include no leg pain, myalgias or shortness of breath. Current antihyperlipidemic treatment includes statins. The current treatment provides significant improvement of lipids. Risk factors for coronary artery disease include dyslipidemia and post-menopausal.      Review of Systems  HENT: Negative.   Respiratory: Negative.  Negative for shortness of breath.   Cardiovascular: Negative.   Gastrointestinal: Negative.   Genitourinary: Negative.   Musculoskeletal: Negative.  Negative for myalgias.  Neurological: Negative.   Hematological: Negative.   Psychiatric/Behavioral: Negative.   All other systems reviewed and are negative.      Objective:   Physical Exam  Vitals reviewed. Constitutional: She is oriented to person, place, and time. She appears well-developed and well-nourished. No distress.  HENT:  Head: Normocephalic and atraumatic.  Right Ear: External ear normal.  Mouth/Throat: Oropharynx is clear and moist.  Eyes: Pupils are equal, round, and reactive to light.  Neck: Normal range of motion. Neck supple. No thyromegaly present.  Cardiovascular: Normal rate, regular rhythm, normal heart sounds and intact distal pulses.   No murmur heard. Pulmonary/Chest: Effort normal and breath sounds normal. No respiratory distress. She has no wheezes.  Abdominal: Soft. Bowel sounds are normal. She exhibits no distension. There is no tenderness.  Musculoskeletal: Normal range of motion. She exhibits no edema and no tenderness.  Neurological: She is alert and oriented to person, place, and time. She has normal reflexes. No cranial  nerve deficit.  Skin: Skin is warm and dry.  Psychiatric: She has a normal mood and affect. Her behavior is normal. Judgment and thought content normal.    BP 124/69  Pulse 65  Temp(Src) 98.1 F (36.7 C) (Oral)  Ht $R'5\' 8"'gz$  (1.727 m)  Wt 180 lb (81.647 kg)  BMI 27.38 kg/m2       Assessment & Plan:  1. Arthritis of knee, right  2. Other and unspecified hyperlipidemia - pravastatin (PRAVACHOL) 40 MG tablet; TAKE 1 TABLET AT BEDTIME  Dispense: 90 tablet; Refill: 3 - CMP14+EGFR - Lipid panel  3. Unspecified vitamin D deficiency - Vit D  25 hydroxy (rtn osteoporosis monitoring)   Continue all meds Labs pending Health Maintenance reviewed Diet and exercise encouraged RTO 6 months  Evelina Dun, FNP

## 2014-06-13 LAB — CMP14+EGFR
ALT: 12 IU/L (ref 0–32)
AST: 15 IU/L (ref 0–40)
Albumin/Globulin Ratio: 2.2 (ref 1.1–2.5)
Albumin: 4.8 g/dL (ref 3.5–5.5)
Alkaline Phosphatase: 70 IU/L (ref 39–117)
BUN/Creatinine Ratio: 22 (ref 9–23)
BUN: 14 mg/dL (ref 6–24)
CHLORIDE: 101 mmol/L (ref 97–108)
CO2: 23 mmol/L (ref 18–29)
CREATININE: 0.63 mg/dL (ref 0.57–1.00)
Calcium: 10.1 mg/dL (ref 8.7–10.2)
GFR calc non Af Amer: 99 mL/min/{1.73_m2} (ref >59)
GFR, EST AFRICAN AMERICAN: 114 mL/min/{1.73_m2} (ref >59)
GLUCOSE: 111 mg/dL — AB (ref 65–99)
Globulin, Total: 2.2 g/dL (ref 1.5–4.5)
Potassium: 4.6 mmol/L (ref 3.5–5.2)
Sodium: 140 mmol/L (ref 134–144)
TOTAL PROTEIN: 7 g/dL (ref 6.0–8.5)
Total Bilirubin: 0.9 mg/dL (ref 0.0–1.2)

## 2014-06-13 LAB — LIPID PANEL
CHOLESTEROL TOTAL: 167 mg/dL (ref 100–199)
Chol/HDL Ratio: 3.3 ratio units (ref 0.0–4.4)
HDL: 51 mg/dL (ref >39)
LDL CALC: 95 mg/dL (ref 0–99)
TRIGLYCERIDES: 107 mg/dL (ref 0–149)
VLDL CHOLESTEROL CAL: 21 mg/dL (ref 5–40)

## 2014-06-13 LAB — VITAMIN D 25 HYDROXY (VIT D DEFICIENCY, FRACTURES): Vit D, 25-Hydroxy: 32.4 ng/mL (ref 30.0–100.0)

## 2014-06-14 ENCOUNTER — Other Ambulatory Visit: Payer: Self-pay | Admitting: Family

## 2014-06-14 ENCOUNTER — Telehealth: Payer: Self-pay | Admitting: Family Medicine

## 2014-06-14 NOTE — Telephone Encounter (Signed)
Message copied by Waverly Ferrari on Wed Jun 14, 2014  3:05 PM ------      Message from: HAWKS, [place] A      Created: Wed Jun 14, 2014 10:40 AM       Kidney and liver function stable- Blood sugar slightly elevated- Will continue to monitor      Cholesterol levels WNL- Continue current meds- low fat diet and exercise      Vit D levels low- Need to increase Vit D BID             ------

## 2014-12-12 ENCOUNTER — Ambulatory Visit: Payer: BC Managed Care – PPO | Admitting: Family

## 2014-12-25 ENCOUNTER — Encounter: Payer: Self-pay | Admitting: Family

## 2014-12-25 ENCOUNTER — Ambulatory Visit (INDEPENDENT_AMBULATORY_CARE_PROVIDER_SITE_OTHER): Payer: BLUE CROSS/BLUE SHIELD | Admitting: Family

## 2014-12-25 VITALS — BP 115/76 | HR 73 | Temp 97.3°F | Ht 68.0 in | Wt 182.4 lb

## 2014-12-25 DIAGNOSIS — M1711 Unilateral primary osteoarthritis, right knee: Secondary | ICD-10-CM

## 2014-12-25 DIAGNOSIS — E785 Hyperlipidemia, unspecified: Secondary | ICD-10-CM

## 2014-12-25 DIAGNOSIS — E559 Vitamin D deficiency, unspecified: Secondary | ICD-10-CM

## 2014-12-25 DIAGNOSIS — M129 Arthropathy, unspecified: Secondary | ICD-10-CM

## 2014-12-25 DIAGNOSIS — J019 Acute sinusitis, unspecified: Secondary | ICD-10-CM

## 2014-12-25 MED ORDER — PRAVASTATIN SODIUM 40 MG PO TABS: ORAL_TABLET | ORAL | Status: DC

## 2014-12-25 MED ORDER — BENZONATATE 200 MG PO CAPS: 200.0000 mg | ORAL_CAPSULE | Freq: Three times a day (TID) | ORAL | Status: DC | PRN

## 2014-12-25 MED ORDER — AMOXICILLIN-POT CLAVULANATE 875-125 MG PO TABS: 1.0000 | ORAL_TABLET | Freq: Two times a day (BID) | ORAL | Status: DC

## 2014-12-25 NOTE — Progress Notes (Signed)
   Subjective:    Patient ID: Lori Livingston, female    DOB: Oct 08, 1956, 59 y.o.   MRN: [id number]  Hyperlipidemia This is a chronic problem. The current episode started more than 1 year ago. The problem is controlled. Recent lipid tests were reviewed and are normal. She has no history of diabetes, hypothyroidism or obesity. Pertinent negatives include no leg pain, myalgias or shortness of breath. Current antihyperlipidemic treatment includes statins. The current treatment provides moderate improvement of lipids. There are no compliance problems.  Risk factors for coronary artery disease include family history, dyslipidemia and post-menopausal.      Review of Systems  Constitutional: Negative.   HENT: Positive for ear pain, sinus pressure and sneezing.   Eyes: Negative.   Respiratory: Positive for cough. Negative for shortness of breath.   Cardiovascular: Negative.  Negative for palpitations.  Gastrointestinal: Negative.   Endocrine: Negative.   Genitourinary: Negative.   Musculoskeletal: Negative.  Negative for myalgias.  Neurological: Negative.  Negative for headaches.  Hematological: Negative.   Psychiatric/Behavioral: Negative.   All other systems reviewed and are negative.      Objective:   Physical Exam  Constitutional: She is oriented to person, place, and time. She appears well-developed and well-nourished. No distress.  HENT:  Head: Normocephalic and atraumatic.  Right Ear: External ear normal.  Left Ear: External ear normal.  Nose: Right sinus exhibits maxillary sinus tenderness and frontal sinus tenderness. Left sinus exhibits maxillary sinus tenderness and frontal sinus tenderness.  Mouth/Throat: Oropharynx is clear and moist.  Nasal passage erythema with mild swelling   Eyes: Pupils are equal, round, and reactive to light.  Neck: Normal range of motion. Neck supple. No thyromegaly present.  Cardiovascular: Normal rate, regular rhythm, normal heart sounds and  intact distal pulses.   No murmur heard. Pulmonary/Chest: Effort normal and breath sounds normal. No respiratory distress. She has no wheezes.  Abdominal: Soft. Bowel sounds are normal. She exhibits no distension. There is no tenderness.  Musculoskeletal: Normal range of motion. She exhibits no edema or tenderness.  Neurological: She is alert and oriented to person, place, and time. She has normal reflexes. No cranial nerve deficit.  Skin: Skin is warm and dry.  Psychiatric: She has a normal mood and affect. Her behavior is normal. Judgment and thought content normal.  Vitals reviewed.   BP 115/76 mmHg  Pulse 73  Temp(Src) 97.3 F (36.3 C) (Oral)  Ht 5\' 8"  (1.727 m)  Wt 182 lb 6.4 oz (82.736 kg)  BMI 27.74 kg/m2       Assessment & Plan:  1. Vitamin D deficiency  2. Hyperlipidemia - pravastatin (PRAVACHOL) 40 MG tablet; TAKE 1 TABLET AT BEDTIME  Dispense: 90 tablet; Refill: 3  3. Arthritis of knee, right  4. Acute sinusitis, recurrence not specified, unspecified location - amoxicillin-clavulanate (AUGMENTIN) 875-125 MG per tablet; Take 1 tablet by mouth 2 (two) times daily.  Dispense: 14 tablet; Refill: 0   Continue all meds Labs drawn at work- copy received- all lab work [place] Maintenance reviewed Diet and exercise encouraged RTO 6 months  Evelina Dun, FNP

## 2014-12-25 NOTE — Addendum Note (Signed)
Addended by: Evelina Dun A on: 12/25/2014 02:37 PM   Modules accepted: Orders

## 2014-12-25 NOTE — Patient Instructions (Signed)

## 2015-05-23 LAB — HM MAMMOGRAPHY

## 2015-05-24 ENCOUNTER — Other Ambulatory Visit: Payer: Self-pay | Admitting: Family

## 2015-06-21 ENCOUNTER — Ambulatory Visit (INDEPENDENT_AMBULATORY_CARE_PROVIDER_SITE_OTHER): Payer: BLUE CROSS/BLUE SHIELD | Admitting: Family

## 2015-06-21 ENCOUNTER — Encounter: Payer: Self-pay | Admitting: Family

## 2015-06-21 VITALS — BP 110/71 | HR 66 | Temp 97.5°F | Ht 68.0 in | Wt 185.8 lb

## 2015-06-21 DIAGNOSIS — E785 Hyperlipidemia, unspecified: Secondary | ICD-10-CM

## 2015-06-21 DIAGNOSIS — M129 Arthropathy, unspecified: Secondary | ICD-10-CM

## 2015-06-21 DIAGNOSIS — M1711 Unilateral primary osteoarthritis, right knee: Secondary | ICD-10-CM

## 2015-06-21 DIAGNOSIS — E559 Vitamin D deficiency, unspecified: Secondary | ICD-10-CM

## 2015-06-21 NOTE — Patient Instructions (Signed)

## 2015-06-21 NOTE — Progress Notes (Signed)
   Subjective:    Patient ID: Lori Livingston, female    DOB: March 22, 1956, 59 y.o.   MRN: [id number]  Pt presents to the office today for chronic follow up. Pt had blood work drawn at work on 06/08/15. Labs discussed and WNL. Since pt's last visit she right shoulder surgery to repair rotator cuff and remove bone spurs. Pt states she was released yesterday and is feeling good. Hyperlipidemia This is a chronic problem. The current episode started more than 1 year ago. The problem is controlled. Recent lipid tests were reviewed and are normal. She has no history of diabetes, hypothyroidism or obesity. Pertinent negatives include no leg pain, myalgias or shortness of breath. Current antihyperlipidemic treatment includes statins. The current treatment provides significant improvement of lipids. There are no compliance problems.  Risk factors for coronary artery disease include family history, dyslipidemia and post-menopausal.      Review of Systems  Constitutional: Negative.   HENT: Negative.   Eyes: Negative.   Respiratory: Negative.  Negative for shortness of breath.   Cardiovascular: Negative.  Negative for palpitations.  Gastrointestinal: Negative.   Endocrine: Negative.   Genitourinary: Negative.   Musculoskeletal: Negative.  Negative for myalgias.  Neurological: Negative.  Negative for headaches.  Hematological: Negative.   Psychiatric/Behavioral: Negative.   All other systems reviewed and are negative.      Objective:   Physical Exam  Constitutional: She is oriented to person, place, and time. She appears well-developed and well-nourished. No distress.  HENT:  Head: Normocephalic and atraumatic.  Right Ear: External ear normal.  Left Ear: External ear normal.  Nose: Nose normal.  Mouth/Throat: Oropharynx is clear and moist.  Eyes: Pupils are equal, round, and reactive to light.  Neck: Normal range of motion. Neck supple. No thyromegaly present.  Cardiovascular: Normal rate,  regular rhythm, normal heart sounds and intact distal pulses.   No murmur heard. Pulmonary/Chest: Effort normal and breath sounds normal. No respiratory distress. She has no wheezes.  Abdominal: Soft. Bowel sounds are normal. She exhibits no distension. There is no tenderness.  Musculoskeletal: Normal range of motion. She exhibits no edema or tenderness.  Neurological: She is alert and oriented to person, place, and time. She has normal reflexes. No cranial nerve deficit.  Skin: Skin is warm and dry.  Psychiatric: She has a normal mood and affect. Her behavior is normal. Judgment and thought content normal.  Vitals reviewed.     BP 110/71 mmHg  Pulse 66  Temp(Src) 97.5 F (36.4 C) (Oral)  Ht 5\' 8"  (1.727 m)  Wt 185 lb 12.8 oz (84.278 kg)  BMI 28.26 kg/m2     Assessment & Plan:  1. Vitamin D deficiency  2. Hyperlipidemia  3. Arthritis of knee, right   Continue all meds Labs discussed Health Maintenance reviewed Diet and exercise encouraged RTO 6 months  Evelina Dun, FNP

## 2015-11-16 ENCOUNTER — Encounter: Payer: Self-pay | Admitting: Family

## 2015-11-16 ENCOUNTER — Ambulatory Visit (INDEPENDENT_AMBULATORY_CARE_PROVIDER_SITE_OTHER): Payer: BLUE CROSS/BLUE SHIELD | Admitting: Family

## 2015-11-16 VITALS — BP 135/81 | HR 61 | Temp 96.9°F | Ht 68.0 in | Wt 185.0 lb

## 2015-11-16 DIAGNOSIS — Z79890 Hormone replacement therapy (postmenopausal): Secondary | ICD-10-CM | POA: Diagnosis not present

## 2015-11-16 DIAGNOSIS — N951 Menopausal and female climacteric states: Secondary | ICD-10-CM

## 2015-11-16 DIAGNOSIS — B351 Tinea unguium: Secondary | ICD-10-CM

## 2015-11-16 DIAGNOSIS — M129 Arthropathy, unspecified: Secondary | ICD-10-CM

## 2015-11-16 DIAGNOSIS — M1711 Unilateral primary osteoarthritis, right knee: Secondary | ICD-10-CM

## 2015-11-16 DIAGNOSIS — E559 Vitamin D deficiency, unspecified: Secondary | ICD-10-CM

## 2015-11-16 DIAGNOSIS — E785 Hyperlipidemia, unspecified: Secondary | ICD-10-CM

## 2015-11-16 MED ORDER — TERBINAFINE HCL 250 MG PO TABS: 250.0000 mg | ORAL_TABLET | Freq: Every day | ORAL | Status: DC

## 2015-11-16 NOTE — Progress Notes (Signed)
   Subjective:    Patient ID: Lori Livingston, female    DOB: 1956-10-07, 59 y.o.   MRN: [id number]  Pt presents to the office today for chronic follow up.  Pt had blood work drawn at work on 09/10/2015. Labs discussed and WNL. A copy scanned into chart. PT is followed by gynecologists every year. Pt is on Estrace after her hysterectomy in 2010 for hot flashes.  Hyperlipidemia This is a chronic problem. The current episode started more than 1 year ago. The problem is controlled. Recent lipid tests were reviewed and are normal. She has no history of diabetes, hypothyroidism or obesity. Pertinent negatives include no leg pain, myalgias or shortness of breath. Current antihyperlipidemic treatment includes statins. The current treatment provides significant improvement of lipids. There are no compliance problems.  Risk factors for coronary artery disease include family history, dyslipidemia and post-menopausal.      Review of Systems  Constitutional: Negative.   HENT: Negative.   Eyes: Negative.   Respiratory: Negative.  Negative for shortness of breath.   Cardiovascular: Negative.  Negative for palpitations.  Gastrointestinal: Negative.   Endocrine: Negative.   Genitourinary: Negative.   Musculoskeletal: Negative.  Negative for myalgias.  Neurological: Negative.  Negative for headaches.  Hematological: Negative.   Psychiatric/Behavioral: Negative.   All other systems reviewed and are negative.      Objective:   Physical Exam  Constitutional: She is oriented to person, place, and time. She appears well-developed and well-nourished. No distress.  HENT:  Head: Normocephalic and atraumatic.  Right Ear: External ear normal.  Left Ear: External ear normal.  Nose: Nose normal.  Mouth/Throat: Oropharynx is clear and moist.  Eyes: Pupils are equal, round, and reactive to light.  Neck: Normal range of motion. Neck supple. No thyromegaly present.  Cardiovascular: Normal rate, regular rhythm,  normal heart sounds and intact distal pulses.   No murmur heard. Pulmonary/Chest: Effort normal and breath sounds normal. No respiratory distress. She has no wheezes.  Abdominal: Soft. Bowel sounds are normal. She exhibits no distension. There is no tenderness.  Musculoskeletal: Normal range of motion. She exhibits no edema or tenderness.  Neurological: She is alert and oriented to person, place, and time. She has normal reflexes. No cranial nerve deficit.  Skin: Skin is warm and dry.  Psychiatric: She has a normal mood and affect. Her behavior is normal. Judgment and thought content normal.  Vitals reviewed.    BP 135/81 mmHg  Pulse 61  Temp(Src) 96.9 F (36.1 C) (Oral)  Ht 5\' 8"  (1.727 m)  Wt 185 lb (83.915 kg)  BMI 28.14 kg/m2      Assessment & Plan:  1. Menopausal syndrome on hormone replacement therapy   2. Onychomycosis of toenail - terbinafine (LAMISIL) 250 MG tablet; Take 1 tablet (250 mg total) by mouth daily.  Dispense: 90 tablet; Refill: 0  3. Arthritis of knee, right  4. Hyperlipidemia  5. Vitamin D deficiency    Continue all meds Labs pending Health Maintenance reviewed Diet and exercise encouraged RTO 6 months   Evelina Dun, FNP

## 2015-11-16 NOTE — Patient Instructions (Addendum)
Health Maintenance, Female Adopting a healthy lifestyle and getting preventive care can go a long way to promote health and wellness. Talk with your health care provider about what schedule of regular examinations is right for you. This is a good chance for you to check in with your provider about disease prevention and staying healthy. In between checkups, there are plenty of things you can do on your own. Experts have done a lot of research about which lifestyle changes and preventive measures are most likely to keep you healthy. Ask your health care provider for more information. WEIGHT AND DIET  Eat a healthy diet 1. Be sure to include plenty of vegetables, fruits, low-fat dairy products, and lean protein. 2. Do not eat a lot of foods high in solid fats, added sugars, or salt. 3. Get regular exercise. This is one of the most important things you can do for your health. 1. Most adults should exercise for at least 150 minutes each week. The exercise should increase your heart rate and make you sweat (moderate-intensity exercise). 2. Most adults should also do strengthening exercises at least twice a week. This is in addition to the moderate-intensity exercise.  Maintain a healthy weight  Body mass index (BMI) is a measurement that can be used to identify possible weight problems. It estimates body fat based on height and weight. Your health care provider can help determine your BMI and help you achieve or maintain a healthy weight.  For females 57 years of age and older:   A BMI below 18.5 is considered underweight.  A BMI of 18.5 to 24.9 is normal.  A BMI of 25 to 29.9 is considered overweight.  A BMI of 30 and above is considered obese.  Watch levels of cholesterol and blood lipids  You should start having your blood tested for lipids and cholesterol at 59 years of age, then have this test every 5 years.  You may need to have your cholesterol levels checked more often if:  Your  lipid or cholesterol levels are high.  You are older than 59 years of age.  You are at high risk for heart disease.  CANCER SCREENING   Lung Cancer  Lung cancer screening is recommended for adults 63-49 years old who are at high risk for lung cancer because of a history of smoking.  A yearly low-dose CT scan of the lungs is recommended for people who:  Currently smoke.  Have quit within the past 15 years.  Have at least a 30-pack-year history of smoking. A pack year is smoking an average of one pack of cigarettes a day for 1 year.  Yearly screening should continue until it has been 15 years since you quit.  Yearly screening should stop if you develop a health problem that would prevent you from having lung cancer treatment.  Breast Cancer  Practice breast self-awareness. This means understanding how your breasts normally appear and feel.  It also means doing regular breast self-exams. Let your health care provider know about any changes, no matter how small.  If you are in your 20s or 30s, you should have a clinical breast exam (CBE) by a health care provider every 1-3 years as part of a regular health exam.  If you are 37 or older, have a CBE every year. Also consider having a breast X-ray (mammogram) every year.  If you have a family history of breast cancer, talk to your health care provider about genetic screening.  If you  are at high risk for breast cancer, talk to your health care provider about having an MRI and a mammogram every year.  Breast cancer gene (BRCA) assessment is recommended for women who have family members with BRCA-related cancers. BRCA-related cancers include:  Breast.  Ovarian.  Tubal.  Peritoneal cancers.  Results of the assessment will determine the need for genetic counseling and BRCA1 and BRCA2 testing. Cervical Cancer Your health care provider may recommend that you be screened regularly for cancer of the pelvic organs (ovaries, uterus,  and vagina). This screening involves a pelvic examination, including checking for microscopic changes to the surface of your cervix (Pap test). You may be encouraged to have this screening done every 3 years, beginning at age 44.  For women ages 35-65, health care providers may recommend pelvic exams and Pap testing every 3 years, or they may recommend the Pap and pelvic exam, combined with testing for human papilloma virus (HPV), every 5 years. Some types of HPV increase your risk of cervical cancer. Testing for HPV may also be done on women of any age with unclear Pap test results.  Other health care providers may not recommend any screening for nonpregnant women who are considered low risk for pelvic cancer and who do not have symptoms. Ask your health care provider if a screening pelvic exam is right for you.  If you have had past treatment for cervical cancer or a condition that could lead to cancer, you need Pap tests and screening for cancer for at least 20 years after your treatment. If Pap tests have been discontinued, your risk factors (such as having a new sexual partner) need to be reassessed to determine if screening should resume. Some women have medical problems that increase the chance of getting cervical cancer. In these cases, your health care provider may recommend more frequent screening and Pap tests. Colorectal Cancer  This type of cancer can be detected and often prevented.  Routine colorectal cancer screening usually begins at 59 years of age and continues through 59 years of age.  Your health care provider may recommend screening at an earlier age if you have risk factors for colon cancer.  Your health care provider may also recommend using home test kits to check for hidden blood in the stool.  A small camera at the end of a tube can be used to examine your colon directly (sigmoidoscopy or colonoscopy). This is done to check for the earliest forms of colorectal  cancer.  Routine screening usually begins at age 40.  Direct examination of the colon should be repeated every 5-10 years through 59 years of age. However, you may need to be screened more often if early forms of precancerous polyps or small growths are found. Skin Cancer  Check your skin from head to toe regularly.  Tell your health care provider about any new moles or changes in moles, especially if there is a change in a mole's shape or color.  Also tell your health care provider if you have a mole that is larger than the size of a pencil eraser.  Always use sunscreen. Apply sunscreen liberally and repeatedly throughout the day.  Protect yourself by wearing long sleeves, pants, a wide-brimmed hat, and sunglasses whenever you are outside. HEART DISEASE, DIABETES, AND HIGH BLOOD PRESSURE   High blood pressure causes heart disease and increases the risk of stroke. High blood pressure is more likely to develop in:  People who have blood pressure in the high end  of the normal range (130-139/85-89 mm Hg).  People who are overweight or obese.  People who are African American.  If you are 38-23 years of age, have your blood pressure checked every 3-5 years. If you are 61 years of age or older, have your blood pressure checked every year. You should have your blood pressure measured twice--once when you are at a hospital or clinic, and once when you are not at a hospital or clinic. Record the average of the two measurements. To check your blood pressure when you are not at a hospital or clinic, you can use:  An automated blood pressure machine at a pharmacy.  A home blood pressure monitor.  If you are between 45 years and 39 years old, ask your health care provider if you should take aspirin to prevent strokes.  Have regular diabetes screenings. This involves taking a blood sample to check your fasting blood sugar level.  If you are at a normal weight and have a low risk for diabetes,  have this test once every three years after 59 years of age.  If you are overweight and have a high risk for diabetes, consider being tested at a younger age or more often. PREVENTING INFECTION  Hepatitis B  If you have a higher risk for hepatitis B, you should be screened for this virus. You are considered at high risk for hepatitis B if:  You were born in a country where hepatitis B is common. Ask your health care provider which countries are considered high risk.  Your parents were born in a high-risk country, and you have not been immunized against hepatitis B (hepatitis B vaccine).  You have HIV or AIDS.  You use needles to inject street drugs.  You live with someone who has hepatitis B.  You have had sex with someone who has hepatitis B.  You get hemodialysis treatment.  You take certain medicines for conditions, including cancer, organ transplantation, and autoimmune conditions. Hepatitis C  Blood testing is recommended for:  Everyone born from [birth year] through 1965.  Anyone with known risk factors for hepatitis C. Sexually transmitted infections (STIs)  You should be screened for sexually transmitted infections (STIs) including gonorrhea and chlamydia if:  You are sexually active and are younger than 59 years of age.  You are older than 59 years of age and your health care provider tells you that you are at risk for this type of infection.  Your sexual activity has changed since you were last screened and you are at an increased risk for chlamydia or gonorrhea. Ask your health care provider if you are at risk.  If you do not have HIV, but are at risk, it may be recommended that you take a prescription medicine daily to prevent HIV infection. This is called pre-exposure prophylaxis (PrEP). You are considered at risk if:  You are sexually active and do not regularly use condoms or know the HIV status of your partner(s).  You take drugs by injection.  You are sexually  active with a partner who has HIV. Talk with your health care provider about whether you are at high risk of being infected with HIV. If you choose to begin PrEP, you should first be tested for HIV. You should then be tested every 3 months for as long as you are taking PrEP.  PREGNANCY   If you are premenopausal and you may become pregnant, ask your health care provider about preconception counseling.  If you may  become pregnant, take 400 to 800 micrograms (mcg) of folic acid every day.  If you want to prevent pregnancy, talk to your health care provider about birth control (contraception). OSTEOPOROSIS AND MENOPAUSE   Osteoporosis is a disease in which the bones lose minerals and strength with aging. This can result in serious bone fractures. Your risk for osteoporosis can be identified using a bone density scan.  If you are 15 years of age or older, or if you are at risk for osteoporosis and fractures, ask your health care provider if you should be screened.  Ask your health care provider whether you should take a calcium or vitamin D supplement to lower your risk for osteoporosis.  Menopause may have certain physical symptoms and risks.  Hormone replacement therapy may reduce some of these symptoms and risks. Talk to your health care provider about whether hormone replacement therapy is right for you.  HOME CARE INSTRUCTIONS   Schedule regular health, dental, and eye exams.  Stay current with your immunizations.   Do not use any tobacco products including cigarettes, chewing tobacco, or electronic cigarettes.  If you are pregnant, do not drink alcohol.  If you are breastfeeding, limit how much and how often you drink alcohol.  Limit alcohol intake to no more than 1 drink per day for nonpregnant women. One drink equals 12 ounces of beer, 5 ounces of wine, or 1 ounces of hard liquor.  Do not use street drugs.  Do not share needles.  Ask your health care provider for help if  you need support or information about quitting drugs.  Tell your health care provider if you often feel depressed.  Tell your health care provider if you have ever been abused or do not feel safe at home.   This information is not intended to replace advice given to you by your health care provider. Make sure you discuss any questions you have with your health care provider.   Document Released: 06/16/2011 Document Revised: 12/22/2014 Document Reviewed: 11/02/2013 Elsevier Interactive Patient Education 2016 [employer] Onychomycosis/Fungal Toenails  WHAT IS IT? An infection that lies within the keratin of your nail plate that is caused by a fungus.  WHY ME? Fungal infections affect all ages, sexes, races, and creeds.  There may be many factors that predispose you to a fungal infection such as age, coexisting medical conditions such as diabetes, or an autoimmune disease; stress, medications, fatigue, genetics, etc.  Bottom line: fungus thrives in a warm, moist environment and your shoes offer such a location.  IS IT CONTAGIOUS? Theoretically, yes.  You do not want to share shoes, nail clippers or files with someone who has fungal toenails.  Walking around barefoot in the same room or sleeping in the same bed is unlikely to transfer the organism.  It is important to realize, however, that fungus can spread easily from one nail to the next on the same foot.  HOW DO WE TREAT THIS?  There are several ways to treat this condition.  Treatment may depend on many factors such as age, medications, pregnancy, liver and kidney conditions, etc.  It is best to ask your doctor which options are available to you.  4. No treatment.   Unlike many other medical concerns, you can live with this condition.  However for many people this can be a painful condition and may lead to ingrown toenails or a bacterial infection.  It is recommended that you keep the nails cut short to help reduce  the amount of fungal  nail. 5. Topical treatment.  These range from herbal remedies to prescription strength nail lacquers.  About 40-50% effective, topicals require twice daily application for approximately 9 to 12 months or until an entirely new nail has grown out.  The most effective topicals are medical grade medications available through physicians offices. 6. Oral antifungal medications.  With an 80-90% cure rate, the most common oral medication requires 3 to 4 months of therapy and stays in your system for a year as the new nail grows out.  Oral antifungal medications do require blood work to make sure it is a safe drug for you.  A liver function panel will be performed prior to starting the medication and after the first month of treatment.  It is important to have the blood work performed to avoid any harmful side effects.  In general, this medication safe but blood work is required. 7. Laser Therapy.  This treatment is performed by applying a specialized laser to the affected nail plate.  This therapy is noninvasive, fast, and non-painful.  It is not covered by insurance and is therefore, out of pocket.  The results have been very good with a 80-95% cure rate.  [facility] is the only practice in the area to offer this therapy. 8. Permanent Nail Avulsion.  Removing the entire nail so that a new nail will not grow back.

## 2015-11-16 NOTE — Addendum Note (Signed)
Addended by: Evelina Dun A on: 11/16/2015 09:29 AM   Modules accepted: Orders

## 2015-12-11 ENCOUNTER — Ambulatory Visit (INDEPENDENT_AMBULATORY_CARE_PROVIDER_SITE_OTHER): Payer: BLUE CROSS/BLUE SHIELD | Admitting: Physician Assistant

## 2015-12-11 ENCOUNTER — Encounter: Payer: Self-pay | Admitting: Physician Assistant

## 2015-12-11 VITALS — BP 127/76 | HR 82 | Temp 97.3°F | Ht 68.0 in | Wt 188.2 lb

## 2015-12-11 DIAGNOSIS — J0110 Acute frontal sinusitis, unspecified: Secondary | ICD-10-CM

## 2015-12-11 MED ORDER — AMOXICILLIN-POT CLAVULANATE 875-125 MG PO TABS: 1.0000 | ORAL_TABLET | Freq: Two times a day (BID) | ORAL | Status: DC

## 2015-12-11 NOTE — Patient Instructions (Signed)

## 2015-12-11 NOTE — Progress Notes (Signed)
Subjective:     Patient ID: Lori Livingston, female   DOB: 1956-09-23, 59 y.o.   MRN: [id number]  HPI Frontal headache with progressive productive cough of yellow sputum  Review of Systems  Constitutional: Positive for activity change and fatigue. Negative for fever and appetite change.  HENT: Positive for congestion, ear pain, postnasal drip, rhinorrhea, sinus pressure and sore throat. Negative for facial swelling and sneezing.   Respiratory: Positive for cough. Negative for shortness of breath and wheezing.   Cardiovascular: Negative.        Objective:   Physical Exam  Constitutional: She appears well-developed and well-nourished.  HENT:  Right Ear: External ear normal.  Left Ear: External ear normal.  Mouth/Throat: No oropharyngeal exudate.  TM's with fluid but nl landmarks  Neck: Neck supple.  Cardiovascular: Normal rate, regular rhythm and normal heart sounds.   Pulmonary/Chest: Effort normal and breath sounds normal. No respiratory distress. She has no wheezes. She has no rales.  Lymphadenopathy:    She has no cervical adenopathy.  Nursing note and vitals reviewed.      Assessment:     Frontal sinusitis    Plan:     Fluids Rest Augmentin bid x 10 days OTC Claritin D F/U prn

## 2016-03-04 ENCOUNTER — Telehealth: Payer: Self-pay | Admitting: Family

## 2016-03-05 NOTE — Telephone Encounter (Signed)
denied °

## 2016-04-08 ENCOUNTER — Encounter: Payer: Self-pay | Admitting: Family

## 2016-04-08 ENCOUNTER — Ambulatory Visit (INDEPENDENT_AMBULATORY_CARE_PROVIDER_SITE_OTHER): Payer: BLUE CROSS/BLUE SHIELD | Admitting: Family

## 2016-04-08 VITALS — BP 126/80 | HR 75 | Temp 97.2°F | Ht 68.0 in | Wt 185.0 lb

## 2016-04-08 DIAGNOSIS — R6889 Other general symptoms and signs: Secondary | ICD-10-CM | POA: Diagnosis not present

## 2016-04-08 DIAGNOSIS — J069 Acute upper respiratory infection, unspecified: Secondary | ICD-10-CM | POA: Diagnosis not present

## 2016-04-08 LAB — VERITOR FLU A/B WAIVED
INFLUENZA A: NEGATIVE
Influenza B: NEGATIVE

## 2016-04-08 MED ORDER — AZITHROMYCIN 250 MG PO TABS: ORAL_TABLET | ORAL | Status: DC

## 2016-04-08 MED ORDER — FLUTICASONE PROPIONATE 50 MCG/ACT NA SUSP: 2.0000 | Freq: Every day | NASAL | Status: DC

## 2016-04-08 NOTE — Patient Instructions (Addendum)
Upper Respiratory Infection, Adult Most upper respiratory infections (URIs) are a viral infection of the air passages leading to the lungs. A URI affects the nose, throat, and upper air passages. The most common type of URI is nasopharyngitis and is typically referred to as "the common cold." URIs run their course and usually go away on their own. Most of the time, a URI does not require medical attention, but sometimes a bacterial infection in the upper airways can follow a viral infection. This is called a secondary infection. Sinus and middle ear infections are common types of secondary upper respiratory infections. Bacterial pneumonia can also complicate a URI. A URI can worsen asthma and chronic obstructive pulmonary disease (COPD). Sometimes, these complications can require emergency medical care and may be life threatening.  CAUSES Almost all URIs are caused by viruses. A virus is a type of germ and can spread from one person to another.  RISKS FACTORS You may be at risk for a URI if:   You smoke.   You have chronic heart or lung disease.  You have a weakened defense (immune) system.   You are very young or very old.   You have nasal allergies or asthma.  You work in crowded or poorly ventilated areas.  You work in health care facilities or schools. SIGNS AND SYMPTOMS  Symptoms typically develop 2-3 days after you come in contact with a cold virus. Most viral URIs last 7-10 days. However, viral URIs from the influenza virus (flu virus) can last 14-18 days and are typically more severe. Symptoms may include:   Runny or stuffy (congested) nose.   Sneezing.   Cough.   Sore throat.   Headache.   Fatigue.   Fever.   Loss of appetite.   Pain in your forehead, behind your eyes, and over your cheekbones (sinus pain).  Muscle aches.  DIAGNOSIS  Your health care provider may diagnose a URI by:  Physical exam.  Tests to check that your symptoms are not due to  another condition such as:  Strep throat.  Sinusitis.  Pneumonia.  Asthma. TREATMENT  A URI goes away on its own with time. It cannot be cured with medicines, but medicines may be prescribed or recommended to relieve symptoms. Medicines may help:  Reduce your fever.  Reduce your cough.  Relieve nasal congestion. HOME CARE INSTRUCTIONS   Take medicines only as directed by your health care provider.   Gargle warm saltwater or take cough drops to comfort your throat as directed by your health care provider.  Use a warm mist humidifier or inhale steam from a shower to increase air moisture. This may make it easier to breathe.  Drink enough fluid to keep your urine clear or pale yellow.   Eat soups and other clear broths and maintain good nutrition.   Rest as needed.   Return to work when your temperature has returned to normal or as your health care provider advises. You may need to stay home longer to avoid infecting others. You can also use a face mask and careful hand washing to prevent spread of the virus.  Increase the usage of your inhaler if you have asthma.   Do not use any tobacco products, including cigarettes, chewing tobacco, or electronic cigarettes. If you need help quitting, ask your health care provider. PREVENTION  The best way to protect yourself from getting a cold is to practice good hygiene.   Avoid oral or hand contact with people with cold   symptoms.   Wash your hands often if contact occurs.  There is no clear evidence that vitamin C, vitamin E, echinacea, or exercise reduces the chance of developing a cold. However, it is always recommended to get plenty of rest, exercise, and practice good nutrition.  SEEK MEDICAL CARE IF:   You are getting worse rather than better.   Your symptoms are not controlled by medicine.   You have chills.  You have worsening shortness of breath.  You have brown or red mucus.  You have yellow or brown nasal  discharge.  You have pain in your face, especially when you bend forward.  You have a fever.  You have swollen neck glands.  You have pain while swallowing.  You have white areas in the back of your throat. SEEK IMMEDIATE MEDICAL CARE IF:   You have severe or persistent:  Headache.  Ear pain.  Sinus pain.  Chest pain.  You have chronic lung disease and any of the following:  Wheezing.  Prolonged cough.  Coughing up blood.  A change in your usual mucus.  You have a stiff neck.  You have changes in your:  Vision.  Hearing.  Thinking.  Mood. MAKE SURE YOU:   Understand these instructions.  Will watch your condition.  Will get help right away if you are not doing well or get worse.   This information is not intended to replace advice given to you by your health care provider. Make sure you discuss any questions you have with your health care provider.   Document Released: 05/27/2001 Document Revised: 04/17/2015 Document Reviewed: 03/08/2014 Elsevier Interactive Patient Education 2016 [employer]  - Take meds as prescribed - Use a cool mist humidifier  -Use saline nose sprays frequently -Saline irrigations of the nose can be very helpful if done frequently.  * 4X daily for 1 week*  * Use of a nettie pot can be helpful with this. Follow directions with this* -Force fluids -For any cough or congestion  Use plain Mucinex- regular strength or max strength is fine   * Children- consult with Pharmacist for dosing -For fever or aces or pains- take tylenol or ibuprofen appropriate for age and weight.  * for fevers greater than 101 orally you may alternate ibuprofen and tylenol every  3 hours. -Throat lozenges if help -New toothbrush in 3 days   Lui Bellis, FNP  

## 2016-04-08 NOTE — Progress Notes (Signed)
Subjective:    Patient ID: Lori Livingston, female    DOB: 1956/12/10, 60 y.o.   MRN: [id number]   Cough This is a new problem. The current episode started in the past 7 days. The problem has been gradually worsening. The cough is productive of sputum. Associated symptoms include chills, ear congestion, nasal congestion, postnasal drip, rhinorrhea and a sore throat. Pertinent negatives include no chest pain, ear pain, fever, shortness of breath or wheezing. Nothing aggravates the symptoms. She has tried OTC cough suppressant and rest for the symptoms. The treatment provided mild relief. There is no history of asthma or bronchitis.      Review of Systems  Constitutional: Positive for chills. Negative for fever.  HENT: Positive for postnasal drip, rhinorrhea, sinus pressure and sore throat. Negative for ear pain.   Eyes: Negative for discharge.  Respiratory: Positive for cough. Negative for shortness of breath and wheezing.   Cardiovascular: Negative.  Negative for chest pain.  Endocrine: Negative.   Skin: Negative.   Neurological: Negative.   All other systems reviewed and are negative.      Objective:   Physical Exam  Constitutional: She is oriented to person, place, and time. She appears well-developed and well-nourished. No distress.  HENT:  Head: Normocephalic and atraumatic.  Right Ear: External ear normal. A middle ear effusion is present.  Left Ear: A middle ear effusion is present.  Nasal passage erythemas with mild swelling  Oropharynx erythemas   Eyes: Conjunctivae are normal. Pupils are equal, round, and reactive to light. Right eye exhibits no discharge. Left eye exhibits no discharge.  Neck: Normal range of motion. Neck supple. No thyromegaly present.  Cardiovascular: Normal rate, regular rhythm, normal heart sounds and intact distal pulses.   No murmur heard. Pulmonary/Chest: Effort normal and breath sounds normal. No respiratory distress. She has no wheezes. She  has no rales. She exhibits no tenderness.  Abdominal: Soft. Bowel sounds are normal. She exhibits no distension. There is no tenderness.  Musculoskeletal: Normal range of motion. She exhibits no edema or tenderness.  Neurological: She is alert and oriented to person, place, and time. She has normal reflexes. No cranial nerve deficit.  Skin: Skin is warm and dry.  Psychiatric: She has a normal mood and affect. Her behavior is normal. Judgment and thought content normal.  Vitals reviewed.    BP 126/80 mmHg  Pulse 75  Temp(Src) 97.2 F (36.2 C) (Oral)  Ht 5\' 8"  (1.727 m)  Wt 185 lb (83.915 kg)  BMI 28.14 kg/m2      Assessment & Plan:  1. Flu-like symptoms - Veritor Flu A/B Waived  2. Acute upper respiratory infection -- Take meds as prescribed - Use a cool mist humidifier  -Use saline nose sprays frequently -Saline irrigations of the nose can be very helpful if done frequently.  * 4X daily for 1 week*  * Use of a nettie pot can be helpful with this. Follow directions with this* -Force fluids -For any cough or congestion  Use plain Mucinex- regular strength or max strength is fine   * Children- consult with Pharmacist for dosing -For fever or aces or pains- take tylenol or ibuprofen appropriate for age and weight.  * for fevers greater than 101 orally you may alternate ibuprofen and tylenol every  3 hours. -Throat lozenges if help -New toothbrush in 3 days - azithromycin (ZITHROMAX Z-PAK) 250 MG tablet; As directed  Dispense: 1 each; Refill: 0 - fluticasone (FLONASE) 50 MCG/ACT nasal  spray; Place 2 sprays into both nostrils daily.  Dispense: 16 g; Refill: [place], FNP

## 2016-04-30 DIAGNOSIS — Z79899 Other long term (current) drug therapy: Secondary | ICD-10-CM | POA: Diagnosis not present

## 2016-04-30 DIAGNOSIS — E782 Mixed hyperlipidemia: Secondary | ICD-10-CM | POA: Diagnosis not present

## 2016-04-30 DIAGNOSIS — Z139 Encounter for screening, unspecified: Secondary | ICD-10-CM | POA: Diagnosis not present

## 2016-05-05 DIAGNOSIS — E559 Vitamin D deficiency, unspecified: Secondary | ICD-10-CM | POA: Diagnosis not present

## 2016-05-05 DIAGNOSIS — E669 Obesity, unspecified: Secondary | ICD-10-CM | POA: Diagnosis not present

## 2016-05-05 DIAGNOSIS — E7800 Pure hypercholesterolemia, unspecified: Secondary | ICD-10-CM | POA: Diagnosis not present

## 2016-05-05 DIAGNOSIS — Z008 Encounter for other general examination: Secondary | ICD-10-CM | POA: Diagnosis not present

## 2016-05-05 DIAGNOSIS — E782 Mixed hyperlipidemia: Secondary | ICD-10-CM | POA: Diagnosis not present

## 2016-05-05 DIAGNOSIS — Z1389 Encounter for screening for other disorder: Secondary | ICD-10-CM | POA: Diagnosis not present

## 2016-05-05 DIAGNOSIS — Z713 Dietary counseling and surveillance: Secondary | ICD-10-CM | POA: Diagnosis not present

## 2016-05-22 DIAGNOSIS — M1711 Unilateral primary osteoarthritis, right knee: Secondary | ICD-10-CM | POA: Diagnosis not present

## 2016-05-22 DIAGNOSIS — M19071 Primary osteoarthritis, right ankle and foot: Secondary | ICD-10-CM | POA: Diagnosis not present

## 2016-05-22 DIAGNOSIS — M19072 Primary osteoarthritis, left ankle and foot: Secondary | ICD-10-CM | POA: Diagnosis not present

## 2016-05-27 DIAGNOSIS — Z1231 Encounter for screening mammogram for malignant neoplasm of breast: Secondary | ICD-10-CM | POA: Diagnosis not present

## 2016-06-09 ENCOUNTER — Encounter: Payer: Self-pay | Admitting: Family

## 2016-06-09 ENCOUNTER — Ambulatory Visit (INDEPENDENT_AMBULATORY_CARE_PROVIDER_SITE_OTHER): Payer: BLUE CROSS/BLUE SHIELD | Admitting: Family

## 2016-06-09 VITALS — BP 116/64 | HR 58 | Temp 97.0°F | Ht 68.0 in | Wt 183.4 lb

## 2016-06-09 DIAGNOSIS — E669 Obesity, unspecified: Secondary | ICD-10-CM | POA: Diagnosis not present

## 2016-06-09 DIAGNOSIS — E663 Overweight: Secondary | ICD-10-CM | POA: Diagnosis not present

## 2016-06-09 DIAGNOSIS — Z713 Dietary counseling and surveillance: Secondary | ICD-10-CM | POA: Diagnosis not present

## 2016-06-09 DIAGNOSIS — E559 Vitamin D deficiency, unspecified: Secondary | ICD-10-CM | POA: Diagnosis not present

## 2016-06-09 DIAGNOSIS — M1711 Unilateral primary osteoarthritis, right knee: Secondary | ICD-10-CM

## 2016-06-09 DIAGNOSIS — M129 Arthropathy, unspecified: Secondary | ICD-10-CM | POA: Diagnosis not present

## 2016-06-09 DIAGNOSIS — E785 Hyperlipidemia, unspecified: Secondary | ICD-10-CM

## 2016-06-09 DIAGNOSIS — E7800 Pure hypercholesterolemia, unspecified: Secondary | ICD-10-CM | POA: Diagnosis not present

## 2016-06-09 MED ORDER — PRAVASTATIN SODIUM 40 MG PO TABS: ORAL_TABLET | ORAL | Status: DC

## 2016-06-09 NOTE — Patient Instructions (Signed)
Health Maintenance, Female Adopting a healthy lifestyle and getting preventive care can go a long way to promote health and wellness. Talk with your health care provider about what schedule of regular examinations is right for you. This is a good chance for you to check in with your provider about disease prevention and staying healthy. In between checkups, there are plenty of things you can do on your own. Experts have done a lot of research about which lifestyle changes and preventive measures are most likely to keep you healthy. Ask your health care provider for more information. WEIGHT AND DIET  Eat a healthy diet  Be sure to include plenty of vegetables, fruits, low-fat dairy products, and lean protein.  Do not eat a lot of foods high in solid fats, added sugars, or salt.  Get regular exercise. This is one of the most important things you can do for your health.  Most adults should exercise for at least 150 minutes each week. The exercise should increase your heart rate and make you sweat (moderate-intensity exercise).  Most adults should also do strengthening exercises at least twice a week. This is in addition to the moderate-intensity exercise.  Maintain a healthy weight  Body mass index (BMI) is a measurement that can be used to identify possible weight problems. It estimates body fat based on height and weight. Your health care provider can help determine your BMI and help you achieve or maintain a healthy weight.  For females 20 years of age and older:   A BMI below 18.5 is considered underweight.  A BMI of 18.5 to 24.9 is normal.  A BMI of 25 to 29.9 is considered overweight.  A BMI of 30 and above is considered obese.  Watch levels of cholesterol and blood lipids  You should start having your blood tested for lipids and cholesterol at 60 years of age, then have this test every 5 years.  You may need to have your cholesterol levels checked more often if:  Your lipid  or cholesterol levels are high.  You are older than 60 years of age.  You are at high risk for heart disease.  CANCER SCREENING   Lung Cancer  Lung cancer screening is recommended for adults 55-80 years old who are at high risk for lung cancer because of a history of smoking.  A yearly low-dose CT scan of the lungs is recommended for people who:  Currently smoke.  Have quit within the past 15 years.  Have at least a 30-pack-year history of smoking. A pack year is smoking an average of one pack of cigarettes a day for 1 year.  Yearly screening should continue until it has been 15 years since you quit.  Yearly screening should stop if you develop a health problem that would prevent you from having lung cancer treatment.  Breast Cancer  Practice breast self-awareness. This means understanding how your breasts normally appear and feel.  It also means doing regular breast self-exams. Let your health care provider know about any changes, no matter how small.  If you are in your 20s or 30s, you should have a clinical breast exam (CBE) by a health care provider every 1-3 years as part of a regular health exam.  If you are 40 or older, have a CBE every year. Also consider having a breast X-ray (mammogram) every year.  If you have a family history of breast cancer, talk to your health care provider about genetic screening.  If you   are at high risk for breast cancer, talk to your health care provider about having an MRI and a mammogram every year.  Breast cancer gene (BRCA) assessment is recommended for women who have family members with BRCA-related cancers. BRCA-related cancers include:  Breast.  Ovarian.  Tubal.  Peritoneal cancers.  Results of the assessment will determine the need for genetic counseling and BRCA1 and BRCA2 testing. Cervical Cancer Your health care provider may recommend that you be screened regularly for cancer of the pelvic organs (ovaries, uterus, and  vagina). This screening involves a pelvic examination, including checking for microscopic changes to the surface of your cervix (Pap test). You may be encouraged to have this screening done every 3 years, beginning at age 21.  For women ages 30-65, health care providers may recommend pelvic exams and Pap testing every 3 years, or they may recommend the Pap and pelvic exam, combined with testing for human papilloma virus (HPV), every 5 years. Some types of HPV increase your risk of cervical cancer. Testing for HPV may also be done on women of any age with unclear Pap test results.  Other health care providers may not recommend any screening for nonpregnant women who are considered low risk for pelvic cancer and who do not have symptoms. Ask your health care provider if a screening pelvic exam is right for you.  If you have had past treatment for cervical cancer or a condition that could lead to cancer, you need Pap tests and screening for cancer for at least 20 years after your treatment. If Pap tests have been discontinued, your risk factors (such as having a new sexual partner) need to be reassessed to determine if screening should resume. Some women have medical problems that increase the chance of getting cervical cancer. In these cases, your health care provider may recommend more frequent screening and Pap tests. Colorectal Cancer  This type of cancer can be detected and often prevented.  Routine colorectal cancer screening usually begins at 60 years of age and continues through 60 years of age.  Your health care provider may recommend screening at an earlier age if you have risk factors for colon cancer.  Your health care provider may also recommend using home test kits to check for hidden blood in the stool.  A small camera at the end of a tube can be used to examine your colon directly (sigmoidoscopy or colonoscopy). This is done to check for the earliest forms of colorectal  cancer.  Routine screening usually begins at age 50.  Direct examination of the colon should be repeated every 5-10 years through 60 years of age. However, you may need to be screened more often if early forms of precancerous polyps or small growths are found. Skin Cancer  Check your skin from head to toe regularly.  Tell your health care provider about any new moles or changes in moles, especially if there is a change in a mole's shape or color.  Also tell your health care provider if you have a mole that is larger than the size of a pencil eraser.  Always use sunscreen. Apply sunscreen liberally and repeatedly throughout the day.  Protect yourself by wearing long sleeves, pants, a wide-brimmed hat, and sunglasses whenever you are outside. HEART DISEASE, DIABETES, AND HIGH BLOOD PRESSURE   High blood pressure causes heart disease and increases the risk of stroke. High blood pressure is more likely to develop in:  People who have blood pressure in the high end   of the normal range (130-139/85-89 mm Hg).  People who are overweight or obese.  People who are African American.  If you are 38-23 years of age, have your blood pressure checked every 3-5 years. If you are 61 years of age or older, have your blood pressure checked every year. You should have your blood pressure measured twice--once when you are at a hospital or clinic, and once when you are not at a hospital or clinic. Record the average of the two measurements. To check your blood pressure when you are not at a hospital or clinic, you can use:  An automated blood pressure machine at a pharmacy.  A home blood pressure monitor.  If you are between 45 years and 39 years old, ask your health care provider if you should take aspirin to prevent strokes.  Have regular diabetes screenings. This involves taking a blood sample to check your fasting blood sugar level.  If you are at a normal weight and have a low risk for diabetes,  have this test once every three years after 60 years of age.  If you are overweight and have a high risk for diabetes, consider being tested at a younger age or more often. PREVENTING INFECTION  Hepatitis B  If you have a higher risk for hepatitis B, you should be screened for this virus. You are considered at high risk for hepatitis B if:  You were born in a country where hepatitis B is common. Ask your health care provider which countries are considered high risk.  Your parents were born in a high-risk country, and you have not been immunized against hepatitis B (hepatitis B vaccine).  You have HIV or AIDS.  You use needles to inject street drugs.  You live with someone who has hepatitis B.  You have had sex with someone who has hepatitis B.  You get hemodialysis treatment.  You take certain medicines for conditions, including cancer, organ transplantation, and autoimmune conditions. Hepatitis C  Blood testing is recommended for:  Everyone born from [birth year] through 1965.  Anyone with known risk factors for hepatitis C. Sexually transmitted infections (STIs)  You should be screened for sexually transmitted infections (STIs) including gonorrhea and chlamydia if:  You are sexually active and are younger than 60 years of age.  You are older than 60 years of age and your health care provider tells you that you are at risk for this type of infection.  Your sexual activity has changed since you were last screened and you are at an increased risk for chlamydia or gonorrhea. Ask your health care provider if you are at risk.  If you do not have HIV, but are at risk, it may be recommended that you take a prescription medicine daily to prevent HIV infection. This is called pre-exposure prophylaxis (PrEP). You are considered at risk if:  You are sexually active and do not regularly use condoms or know the HIV status of your partner(s).  You take drugs by injection.  You are sexually  active with a partner who has HIV. Talk with your health care provider about whether you are at high risk of being infected with HIV. If you choose to begin PrEP, you should first be tested for HIV. You should then be tested every 3 months for as long as you are taking PrEP.  PREGNANCY   If you are premenopausal and you may become pregnant, ask your health care provider about preconception counseling.  If you may  become pregnant, take 400 to 800 micrograms (mcg) of folic acid every day.  If you want to prevent pregnancy, talk to your health care provider about birth control (contraception). OSTEOPOROSIS AND MENOPAUSE   Osteoporosis is a disease in which the bones lose minerals and strength with aging. This can result in serious bone fractures. Your risk for osteoporosis can be identified using a bone density scan.  If you are 61 years of age or older, or if you are at risk for osteoporosis and fractures, ask your health care provider if you should be screened.  Ask your health care provider whether you should take a calcium or vitamin D supplement to lower your risk for osteoporosis.  Menopause may have certain physical symptoms and risks.  Hormone replacement therapy may reduce some of these symptoms and risks. Talk to your health care provider about whether hormone replacement therapy is right for you.  HOME CARE INSTRUCTIONS   Schedule regular health, dental, and eye exams.  Stay current with your immunizations.   Do not use any tobacco products including cigarettes, chewing tobacco, or electronic cigarettes.  If you are pregnant, do not drink alcohol.  If you are breastfeeding, limit how much and how often you drink alcohol.  Limit alcohol intake to no more than 1 drink per day for nonpregnant women. One drink equals 12 ounces of beer, 5 ounces of wine, or 1 ounces of hard liquor.  Do not use street drugs.  Do not share needles.  Ask your health care provider for help if  you need support or information about quitting drugs.  Tell your health care provider if you often feel depressed.  Tell your health care provider if you have ever been abused or do not feel safe at home.   This information is not intended to replace advice given to you by your health care provider. Make sure you discuss any questions you have with your health care provider.   Document Released: 06/16/2011 Document Revised: 12/22/2014 Document Reviewed: 11/02/2013 Elsevier Interactive Patient Education [employer].

## 2016-06-09 NOTE — Progress Notes (Signed)
Subjective:    Patient ID: Lori Livingston, female    DOB: 1956-08-01, 60 y.o.   MRN: [id number]  Pt presents to the office today for chronic follow up.  Pt had blood work drawn at work on 04/30/16. Labs discussed and WNL. A copy scanned into chart. PT is followed by gynecologists every year. Pt is on Estrace after her hysterectomy in 2010 for hot flashes.  Hyperlipidemia This is a chronic problem. The current episode started more than 1 year ago. The problem is controlled. Recent lipid tests were reviewed and are normal. She has no history of diabetes, hypothyroidism or obesity. Pertinent negatives include no leg pain, myalgias or shortness of breath. Current antihyperlipidemic treatment includes statins. The current treatment provides significant improvement of lipids. There are no compliance problems.  Risk factors for coronary artery disease include family history, dyslipidemia and post-menopausal.  Arthritis Presents for follow-up visit. The disease course has been stable. She complains of pain. Affected locations include the right knee. Her pain is at a severity of 8/10. Pertinent negatives include no dysuria, fever, pain while resting or Raynaud's syndrome. Her past medical history is significant for osteoarthritis. Her pertinent risk factors include overuse. Past treatments include rest and NSAIDs. The treatment provided mild relief.      Review of Systems  Constitutional: Negative.  Negative for fever.  HENT: Negative.   Eyes: Negative.   Respiratory: Negative.  Negative for shortness of breath.   Cardiovascular: Negative.  Negative for palpitations.  Gastrointestinal: Negative.   Endocrine: Negative.   Genitourinary: Negative.  Negative for dysuria.  Musculoskeletal: Positive for arthritis. Negative for myalgias.  Neurological: Negative.  Negative for headaches.  Hematological: Negative.   Psychiatric/Behavioral: Negative.   All other systems reviewed and are negative.        Objective:   Physical Exam  Constitutional: She is oriented to person, place, and time. She appears well-developed and well-nourished. No distress.  HENT:  Head: Normocephalic and atraumatic.  Right Ear: External ear normal.  Left Ear: External ear normal.  Nose: Nose normal.  Mouth/Throat: Oropharynx is clear and moist.  Eyes: Pupils are equal, round, and reactive to light.  Neck: Normal range of motion. Neck supple. No thyromegaly present.  Cardiovascular: Normal rate, regular rhythm, normal heart sounds and intact distal pulses.   No murmur heard. Pulmonary/Chest: Effort normal and breath sounds normal. No respiratory distress. She has no wheezes.  Abdominal: Soft. Bowel sounds are normal. She exhibits no distension. There is no tenderness.  Musculoskeletal: Normal range of motion. She exhibits no edema or tenderness.  Neurological: She is alert and oriented to person, place, and time. She has normal reflexes. No cranial nerve deficit.  Skin: Skin is warm and dry.  Psychiatric: She has a normal mood and affect. Her behavior is normal. Judgment and thought content normal.  Vitals reviewed.    BP 116/64 mmHg  Pulse 58  Temp(Src) 97 F (36.1 C) (Oral)  Ht 5\' 8"  (1.727 m)  Wt 183 lb 6.4 oz (83.19 kg)  BMI 27.89 kg/m2      Assessment & Plan:  1. Hyperlipidemia - pravastatin (PRAVACHOL) 40 MG tablet; TAKE 1 TABLET AT BEDTIME  Dispense: 90 tablet; Refill: 3  2. Vitamin D deficiency  3. Arthritis of knee, right  4. Overweight (BMI 25.0-29.9)    Continue all meds Labs discussed Health Maintenance reviewed- PT refuses TDAP at this tme Diet and exercise encouraged RTO 6 months  Evelina Dun, FNP

## 2016-08-05 DIAGNOSIS — M1711 Unilateral primary osteoarthritis, right knee: Secondary | ICD-10-CM | POA: Diagnosis not present

## 2016-08-05 DIAGNOSIS — Z1211 Encounter for screening for malignant neoplasm of colon: Secondary | ICD-10-CM | POA: Diagnosis not present

## 2016-08-20 DIAGNOSIS — Z139 Encounter for screening, unspecified: Secondary | ICD-10-CM | POA: Diagnosis not present

## 2016-08-20 DIAGNOSIS — Z79899 Other long term (current) drug therapy: Secondary | ICD-10-CM | POA: Diagnosis not present

## 2016-08-20 DIAGNOSIS — E782 Mixed hyperlipidemia: Secondary | ICD-10-CM | POA: Diagnosis not present

## 2016-09-03 DIAGNOSIS — Z1389 Encounter for screening for other disorder: Secondary | ICD-10-CM | POA: Diagnosis not present

## 2016-09-03 DIAGNOSIS — E559 Vitamin D deficiency, unspecified: Secondary | ICD-10-CM | POA: Diagnosis not present

## 2016-09-03 DIAGNOSIS — Z008 Encounter for other general examination: Secondary | ICD-10-CM | POA: Diagnosis not present

## 2016-09-03 DIAGNOSIS — E782 Mixed hyperlipidemia: Secondary | ICD-10-CM | POA: Diagnosis not present

## 2016-09-22 ENCOUNTER — Encounter: Payer: Self-pay | Admitting: *Deleted

## 2016-09-23 DIAGNOSIS — Z23 Encounter for immunization: Secondary | ICD-10-CM | POA: Diagnosis not present

## 2016-09-30 ENCOUNTER — Encounter: Payer: Self-pay | Admitting: Family

## 2016-11-03 DIAGNOSIS — E7800 Pure hypercholesterolemia, unspecified: Secondary | ICD-10-CM | POA: Diagnosis not present

## 2016-11-03 DIAGNOSIS — Z713 Dietary counseling and surveillance: Secondary | ICD-10-CM | POA: Diagnosis not present

## 2016-11-03 DIAGNOSIS — E669 Obesity, unspecified: Secondary | ICD-10-CM | POA: Diagnosis not present

## 2016-11-24 DIAGNOSIS — M7062 Trochanteric bursitis, left hip: Secondary | ICD-10-CM | POA: Diagnosis not present

## 2016-12-03 DIAGNOSIS — M7062 Trochanteric bursitis, left hip: Secondary | ICD-10-CM | POA: Diagnosis not present

## 2016-12-18 ENCOUNTER — Encounter: Payer: Self-pay | Admitting: Family Medicine

## 2016-12-18 ENCOUNTER — Ambulatory Visit (INDEPENDENT_AMBULATORY_CARE_PROVIDER_SITE_OTHER): Payer: BLUE CROSS/BLUE SHIELD | Admitting: Family Medicine

## 2016-12-18 ENCOUNTER — Ambulatory Visit: Payer: BLUE CROSS/BLUE SHIELD | Admitting: Family

## 2016-12-18 VITALS — BP 109/75 | HR 75 | Temp 97.1°F | Ht 68.0 in | Wt 190.1 lb

## 2016-12-18 DIAGNOSIS — J0110 Acute frontal sinusitis, unspecified: Secondary | ICD-10-CM

## 2016-12-18 MED ORDER — AZITHROMYCIN 250 MG PO TABS: ORAL_TABLET | ORAL | 0 refills | Status: DC

## 2016-12-18 MED ORDER — FLUTICASONE PROPIONATE 50 MCG/ACT NA SUSP: 2.0000 | Freq: Every day | NASAL | 6 refills | Status: DC

## 2016-12-18 NOTE — Progress Notes (Signed)
BP 109/75   Pulse 75   Temp 97.1 F (36.2 C) (Oral)   Ht 5\' 8"  (1.727 m)   Wt 190 lb 2 oz (86.2 kg)   BMI 28.91 kg/m    Subjective:    Patient ID: Lori Livingston, female    DOB: 1956/08/15, 61 y.o.   MRN: [id number]  HPI: Lori Livingston is a 61 y.o. female presenting on 12/18/2016 for Sinusitis (sinus congestion and pressure, drainage; began 2 days ago; has taken Tylenol and Sudafed)   HPI Sinus congestion and pressure and drainage Patient has been having sinus congestion and pressure and drainage has been going on for the past 2 days. She's been taking Tylenol and Sudafed which helped some. She is also been taking Flonase which has helped some as well. She is getting a lot of postnasal drainage and yellow green mucus drainage. She denies any fevers or chills or shortness of breath or wheezing.  Relevant past medical, surgical, family and social history reviewed and updated as indicated. Interim medical history since our last visit reviewed. Allergies and medications reviewed and updated.  Review of Systems  Constitutional: Negative for chills and fever.  HENT: Positive for congestion, postnasal drip, rhinorrhea, sinus pressure and sore throat. Negative for ear discharge, ear pain and sneezing.   Eyes: Negative for pain, redness and visual disturbance.  Respiratory: Positive for cough. Negative for chest tightness, shortness of breath and wheezing.   Cardiovascular: Negative for chest pain and leg swelling.  Genitourinary: Negative for difficulty urinating and dysuria.  Musculoskeletal: Negative for back pain and gait problem.  Skin: Negative for rash.  Neurological: Negative for light-headedness and headaches.  Psychiatric/Behavioral: Negative for agitation and behavioral problems.  All other systems reviewed and are negative.   Per HPI unless specifically indicated above   Allergies as of 12/18/2016      Reactions   Celebrex [celecoxib]    Indomethacin    Propoxyphene  N-acetaminophen    Nickel Rash      Medication List       Accurate as of 12/18/16 12:01 PM. Always use your most recent med list.          azithromycin 250 MG tablet Commonly known as:  ZITHROMAX Take 2 the first day and then one each day after.   Coenzyme Q10 200 MG capsule Take 200 mg by mouth daily.   estradiol 1 MG tablet Commonly known as:  ESTRACE Take 0.5 mg by mouth daily.   fluticasone 50 MCG/ACT nasal spray Commonly known as:  FLONASE Place 2 sprays into both nostrils daily.   meloxicam 15 MG tablet Commonly known as:  MOBIC Take 15 mg by mouth daily.   multivitamin tablet Take 1 tablet by mouth daily.   pravastatin 40 MG tablet Commonly known as:  PRAVACHOL TAKE 1 TABLET AT BEDTIME   VITAMIN B-12 PO Take by mouth.   Vitamin D2 2000 units Tabs Take 2,000 Int'l Units by mouth daily.   ZYRTEC ALLERGY PO Take by mouth.          Objective:    BP 109/75   Pulse 75   Temp 97.1 F (36.2 C) (Oral)   Ht 5\' 8"  (1.727 m)   Wt 190 lb 2 oz (86.2 kg)   BMI 28.91 kg/m   Wt Readings from Last 3 Encounters:  12/18/16 190 lb 2 oz (86.2 kg)  06/09/16 183 lb 6.4 oz (83.2 kg)  04/08/16 185 lb (83.9 kg)    Physical  Exam  Constitutional: She is oriented to person, place, and time. She appears well-developed and well-nourished. No distress.  HENT:  Right Ear: Tympanic membrane, external ear and ear canal normal.  Left Ear: Tympanic membrane, external ear and ear canal normal.  Nose: Mucosal edema and rhinorrhea present. No epistaxis. Right sinus exhibits frontal sinus tenderness. Right sinus exhibits no maxillary sinus tenderness. Left sinus exhibits frontal sinus tenderness. Left sinus exhibits no maxillary sinus tenderness.  Mouth/Throat: Uvula is midline and mucous membranes are normal. Posterior oropharyngeal edema and posterior oropharyngeal erythema present. No oropharyngeal exudate or tonsillar abscesses.  Eyes: Conjunctivae and EOM are normal.    Cardiovascular: Normal rate, regular rhythm, normal heart sounds and intact distal pulses.   No murmur heard. Pulmonary/Chest: Effort normal and breath sounds normal. No respiratory distress. She has no wheezes.  Musculoskeletal: Normal range of motion. She exhibits no edema or tenderness.  Neurological: She is alert and oriented to person, place, and time. Coordination normal.  Skin: Skin is warm and dry. No rash noted. She is not diaphoretic.  Psychiatric: She has a normal mood and affect. Her behavior is normal.  Vitals reviewed.     Assessment & Plan:   Problem List Items Addressed This Visit    None    Visit Diagnoses    Acute non-recurrent frontal sinusitis    -  Primary   Relevant Medications   fluticasone (FLONASE) 50 MCG/ACT nasal spray   azithromycin (ZITHROMAX) 250 MG tablet       Follow up plan: Return if symptoms worsen or fail to improve.  Counseling provided for all of the vaccine components No orders of the defined types were placed in this encounter.   Caryl Pina, MD [facility] 12/18/2016, 12:01 PM

## 2016-12-22 ENCOUNTER — Ambulatory Visit: Payer: BLUE CROSS/BLUE SHIELD | Admitting: Family

## 2017-01-20 ENCOUNTER — Ambulatory Visit: Payer: BLUE CROSS/BLUE SHIELD | Admitting: Family

## 2017-01-29 DIAGNOSIS — Z6829 Body mass index (BMI) 29.0-29.9, adult: Secondary | ICD-10-CM | POA: Diagnosis not present

## 2017-01-29 DIAGNOSIS — Z01419 Encounter for gynecological examination (general) (routine) without abnormal findings: Secondary | ICD-10-CM | POA: Diagnosis not present

## 2017-02-03 ENCOUNTER — Ambulatory Visit (INDEPENDENT_AMBULATORY_CARE_PROVIDER_SITE_OTHER): Payer: BLUE CROSS/BLUE SHIELD | Admitting: Family

## 2017-02-03 ENCOUNTER — Encounter: Payer: Self-pay | Admitting: Family

## 2017-02-03 VITALS — BP 127/83 | HR 64 | Temp 97.1°F | Ht 68.0 in | Wt 191.4 lb

## 2017-02-03 DIAGNOSIS — M1711 Unilateral primary osteoarthritis, right knee: Secondary | ICD-10-CM | POA: Diagnosis not present

## 2017-02-03 DIAGNOSIS — N951 Menopausal and female climacteric states: Secondary | ICD-10-CM | POA: Diagnosis not present

## 2017-02-03 DIAGNOSIS — Z79890 Hormone replacement therapy (postmenopausal): Secondary | ICD-10-CM | POA: Diagnosis not present

## 2017-02-03 DIAGNOSIS — E559 Vitamin D deficiency, unspecified: Secondary | ICD-10-CM

## 2017-02-03 DIAGNOSIS — E663 Overweight: Secondary | ICD-10-CM | POA: Diagnosis not present

## 2017-02-03 DIAGNOSIS — Z23 Encounter for immunization: Secondary | ICD-10-CM | POA: Diagnosis not present

## 2017-02-03 DIAGNOSIS — E785 Hyperlipidemia, unspecified: Secondary | ICD-10-CM | POA: Diagnosis not present

## 2017-02-03 NOTE — Addendum Note (Signed)
Addended by: Shelbie Ammons on: 02/03/2017 09:52 AM   Modules accepted: Orders

## 2017-02-03 NOTE — Patient Instructions (Signed)
Arthritis Introduction Arthritis is a term that is commonly used to refer to joint pain or joint disease. There are more than 100 types of arthritis. What are the causes? The most common cause of this condition is wear and tear of a joint. Other causes include:  Gout.  Inflammation of a joint.  An infection of a joint.  Sprains and other injuries near the joint.  A drug reaction or allergic reaction. In some cases, the cause may not be known. What are the signs or symptoms? The main symptom of this condition is pain in the joint with movement. Other symptoms include:  Redness, swelling, or stiffness at a joint.  Warmth coming from the joint.  Fever.  Overall feeling of illness. How is this diagnosed? This condition may be diagnosed with a physical exam and tests, including:  Blood tests.  Urine tests.  Imaging tests, such as MRI, X-rays, or a CT scan. Sometimes, fluid is removed from a joint for testing. How is this treated? Treatment for this condition may involve:  Treatment of the cause, if it is known.  Rest.  Raising (elevating) the joint.  Applying cold or hot packs to the joint.  Medicines to improve symptoms and reduce inflammation.  Injections of a steroid such as cortisone into the joint to help reduce pain and inflammation. Depending on the cause of your arthritis, you may need to make lifestyle changes to reduce stress on your joint. These changes may include exercising more and losing weight. Follow these instructions at home: Medicines  Take over-the-counter and prescription medicines only as told by your health care provider.  Do not take aspirin to relieve pain if gout is suspected. Activity  Rest your joint if told by your health care provider. Rest is important when your disease is active and your joint feels painful, swollen, or stiff.  Avoid activities that make the pain worse. It is important to balance activity with rest.  Exercise  your joint regularly with range-of-motion exercises as told by your health care provider. Try doing low-impact exercise, such as:  Swimming.  Water aerobics.  Biking.  Walking. Joint Care   If your joint is swollen, keep it elevated if told by your health care provider.  If your joint feels stiff in the morning, try taking a warm shower.  If directed, apply heat to the joint. If you have diabetes, do not apply heat without permission from your health care provider.  Put a towel between the joint and the hot pack or heating pad.  Leave the heat on the area for 20-30 minutes.  If directed, apply ice to the joint:  Put ice in a plastic bag.  Place a towel between your skin and the bag.  Leave the ice on for 20 minutes, 2-3 times per day.  Keep all follow-up visits as told by your health care provider. This is important. Contact a health care provider if:  The pain gets worse.  You have a fever. Get help right away if:  You develop severe joint pain, swelling, or redness.  Many joints become painful and swollen.  You develop severe back pain.  You develop severe weakness in your leg.  You cannot control your bladder or bowels. This information is not intended to replace advice given to you by your health care provider. Make sure you discuss any questions you have with your health care provider. Document Released: 01/08/2005 Document Revised: 05/08/2016 Document Reviewed: 02/26/2015  2017 Elsevier  

## 2017-02-03 NOTE — Progress Notes (Addendum)
   Subjective:    Patient ID: Lori Livingston, female    DOB: 03/28/1956, 61 y.o.   MRN: [id number]  Pt presents to the office today for chronic follow up.   PT is followed by gynecologists every year. Pt is on Estrace after her hysterectomy in 2010 for hot flashes.  Hyperlipidemia  This is a chronic problem. The current episode started more than 1 year ago. The problem is controlled. Recent lipid tests were reviewed and are normal. She has no history of diabetes, hypothyroidism or obesity. Pertinent negatives include no leg pain, myalgias or shortness of breath. Current antihyperlipidemic treatment includes statins. The current treatment provides significant improvement of lipids. There are no compliance problems.  Risk factors for coronary artery disease include family history, dyslipidemia and post-menopausal.  Arthritis  Presents for follow-up visit. The disease course has been stable. She complains of pain. Affected locations include the right knee. Her pain is at a severity of 8/10. Pertinent negatives include no dysuria, fever, pain while resting or Raynaud's syndrome. Her past medical history is significant for osteoarthritis. Her pertinent risk factors include overuse. Past treatments include rest and NSAIDs. The treatment provided mild relief.      Review of Systems  Constitutional: Negative.  Negative for fever.  HENT: Negative.   Eyes: Negative.   Respiratory: Negative.  Negative for shortness of breath.   Cardiovascular: Negative.  Negative for palpitations.  Gastrointestinal: Negative.   Endocrine: Negative.   Genitourinary: Negative.  Negative for dysuria.  Musculoskeletal: Positive for arthritis. Negative for myalgias.  Neurological: Negative.  Negative for headaches.  Hematological: Negative.   Psychiatric/Behavioral: Negative.   All other systems reviewed and are negative.      Objective:   Physical Exam  Constitutional: She is oriented to person, place, and time.  She appears well-developed and well-nourished. No distress.  HENT:  Head: Normocephalic and atraumatic.  Right Ear: External ear normal.  Left Ear: External ear normal.  Nose: Nose normal.  Mouth/Throat: Oropharynx is clear and moist.  Eyes: Pupils are equal, round, and reactive to light.  Neck: Normal range of motion. Neck supple. No thyromegaly present.  Cardiovascular: Normal rate, regular rhythm, normal heart sounds and intact distal pulses.   No murmur heard. Pulmonary/Chest: Effort normal and breath sounds normal. No respiratory distress. She has no wheezes.  Abdominal: Soft. Bowel sounds are normal. She exhibits no distension. There is no tenderness.  Musculoskeletal: Normal range of motion. She exhibits no edema or tenderness.  Neurological: She is alert and oriented to person, place, and time. She has normal reflexes. No cranial nerve deficit.  Skin: Skin is warm and dry.  Psychiatric: She has a normal mood and affect. Her behavior is normal. Judgment and thought content normal.  Vitals reviewed.    BP 127/83   Pulse 64   Temp 97.1 F (36.2 C) (Oral)   Ht '5\' 8"'$  (1.727 m)   Wt 191 lb 6.4 oz (86.8 kg)   BMI 29.10 kg/m       Assessment & Plan:  1. Arthritis of knee, right - CMP14+EGFR  2. Hyperlipidemia, unspecified hyperlipidemia type - CMP14+EGFR - Lipid panel  3. Vitamin D deficiency - CMP14+EGFR  4. 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

## 2017-02-04 LAB — LIPID PANEL
Chol/HDL Ratio: 3.6 (ref 0.0–4.4)
Cholesterol, Total: 159 mg/dL (ref 100–199)
HDL: 44 mg/dL (ref >39)
LDL Calculated: 75 (ref 0–99)
Triglycerides: 201 mg/dL — ABNORMAL HIGH (ref 0–149)
VLDL Cholesterol Cal: 40 (ref 5–40)

## 2017-02-04 LAB — CMP14+EGFR
A/G RATIO: 1.7 (ref 1.2–2.2)
ALBUMIN: 4.4 g/dL (ref 3.6–4.8)
ALT: 16 IU/L (ref 0–32)
AST: 17 IU/L (ref 0–40)
Alkaline Phosphatase: 60 IU/L (ref 39–117)
BILIRUBIN TOTAL: 0.9 mg/dL (ref 0.0–1.2)
BUN / CREAT RATIO: 27 (ref 12–28)
BUN: 14 mg/dL (ref 8–27)
CHLORIDE: 102 mmol/L (ref 96–106)
CO2: 21 mmol/L (ref 18–29)
Calcium: 9.1 mg/dL (ref 8.7–10.3)
Creatinine, Ser: 0.52 mg/dL — ABNORMAL LOW (ref 0.57–1.00)
GFR calc non Af Amer: 104 (ref >59)
GFR, EST AFRICAN AMERICAN: 120 (ref >59)
Globulin, Total: 2.6 (ref 1.5–4.5)
Glucose: 102 mg/dL — ABNORMAL HIGH (ref 65–99)
POTASSIUM: 3.9 mmol/L (ref 3.5–5.2)
SODIUM: 141 mmol/L (ref 134–144)
TOTAL PROTEIN: 7 g/dL (ref 6.0–8.5)

## 2017-03-04 DIAGNOSIS — Z713 Dietary counseling and surveillance: Secondary | ICD-10-CM | POA: Diagnosis not present

## 2017-03-04 DIAGNOSIS — Z008 Encounter for other general examination: Secondary | ICD-10-CM | POA: Diagnosis not present

## 2017-03-04 DIAGNOSIS — Z139 Encounter for screening, unspecified: Secondary | ICD-10-CM | POA: Diagnosis not present

## 2017-03-04 DIAGNOSIS — E559 Vitamin D deficiency, unspecified: Secondary | ICD-10-CM | POA: Diagnosis not present

## 2017-03-04 DIAGNOSIS — E782 Mixed hyperlipidemia: Secondary | ICD-10-CM | POA: Diagnosis not present

## 2017-03-04 DIAGNOSIS — Z1389 Encounter for screening for other disorder: Secondary | ICD-10-CM | POA: Diagnosis not present

## 2017-03-16 ENCOUNTER — Encounter: Payer: Self-pay | Admitting: Family

## 2017-03-16 ENCOUNTER — Ambulatory Visit (INDEPENDENT_AMBULATORY_CARE_PROVIDER_SITE_OTHER): Payer: BLUE CROSS/BLUE SHIELD | Admitting: Family

## 2017-03-16 VITALS — BP 121/73 | HR 66 | Temp 97.4°F | Ht 68.0 in | Wt 188.6 lb

## 2017-03-16 DIAGNOSIS — J0100 Acute maxillary sinusitis, unspecified: Secondary | ICD-10-CM

## 2017-03-16 MED ORDER — BENZONATATE 200 MG PO CAPS: 200.0000 mg | ORAL_CAPSULE | Freq: Three times a day (TID) | ORAL | 1 refills | Status: DC | PRN

## 2017-03-16 MED ORDER — AMOXICILLIN-POT CLAVULANATE 875-125 MG PO TABS: 1.0000 | ORAL_TABLET | Freq: Two times a day (BID) | ORAL | 0 refills | Status: DC

## 2017-03-16 NOTE — Patient Instructions (Signed)

## 2017-03-16 NOTE — Progress Notes (Signed)
   Subjective:    Patient ID: Lori Livingston, female    DOB: [birth year], 61 y.o.   MRN: [id number]  Cough  Associated symptoms include ear pain and rhinorrhea. Pertinent negatives include no headaches.  URI   This is a new problem. The current episode started in the past 7 days (four days ago). The problem has been gradually worsening. There has been no fever. Associated symptoms include congestion, coughing, ear pain, a plugged ear sensation, rhinorrhea, sinus pain, sneezing and swollen glands. Pertinent negatives include no headaches. She has tried decongestant for the symptoms. The treatment provided mild relief.      Review of Systems  HENT: Positive for congestion, ear pain, rhinorrhea, sinus pain and sneezing.   Respiratory: Positive for cough.   Neurological: Negative for headaches.  All other systems reviewed and are negative.      Objective:   Physical Exam  Constitutional: She is oriented to person, place, and time. She appears well-developed and well-nourished. No distress.  HENT:  Head: Normocephalic and atraumatic.  Right Ear: External ear normal.  Left Ear: External ear normal. Tympanic membrane is erythematous and bulging.  Nose: Mucosal edema and rhinorrhea present. Right sinus exhibits maxillary sinus tenderness. Left sinus exhibits maxillary sinus tenderness.  Mouth/Throat: Posterior oropharyngeal erythema present.  Eyes: Pupils are equal, round, and reactive to light.  Neck: Normal range of motion. Neck supple. No thyromegaly present.  Cardiovascular: Normal rate, regular rhythm, normal heart sounds and intact distal pulses.   No murmur heard. Pulmonary/Chest: Effort normal and breath sounds normal. No respiratory distress. She has no wheezes.  Dry intermittent cough   Abdominal: Soft. Bowel sounds are normal. She exhibits no distension. There is no tenderness.  Musculoskeletal: Normal range of motion. She exhibits no edema or tenderness.  Neurological: She is  alert and oriented to person, place, and time.  Skin: Skin is warm and dry.  Psychiatric: She has a normal mood and affect. Her behavior is normal. Judgment and thought content normal.  Vitals reviewed.     BP 121/73   Pulse 66   Temp 97.4 F (36.3 C) (Oral)   Ht 5\' 8"  (1.727 m)   Wt 188 lb 9.6 oz (85.5 kg)   BMI 28.68 kg/m      Assessment & Plan:  1. Acute maxillary sinusitis, recurrence not specified - Take meds as prescribed - Use a cool mist humidifier  -Use saline nose sprays frequently -Saline irrigations of the nose can be very helpful if done frequently.  * 4X daily for 1 week*  * Use of a nettie pot can be helpful with this. Follow directions with this* -Force fluids -For any cough or congestion  Use plain Mucinex- regular strength or max strength is fine   * Children- consult with Pharmacist for dosing -For fever or aces or pains- take tylenol or ibuprofen appropriate for age and weight.  * for fevers greater than 101 orally you may alternate ibuprofen and tylenol every  3 hours. -Throat lozenges if help -New toothbrush in 3 days - amoxicillin-clavulanate (AUGMENTIN) 875-125 MG tablet; Take 1 tablet by mouth 2 (two) times daily.  Dispense: 14 tablet; Refill: 0 - benzonatate (TESSALON) 200 MG capsule; Take 1 capsule (200 mg total) by mouth 3 (three) times daily as needed.  Dispense: 30 capsule; Refill: [place], FNP

## 2017-03-23 DIAGNOSIS — E782 Mixed hyperlipidemia: Secondary | ICD-10-CM | POA: Diagnosis not present

## 2017-03-23 DIAGNOSIS — E559 Vitamin D deficiency, unspecified: Secondary | ICD-10-CM | POA: Diagnosis not present

## 2017-03-23 DIAGNOSIS — R7301 Impaired fasting glucose: Secondary | ICD-10-CM | POA: Diagnosis not present

## 2017-03-25 DIAGNOSIS — M1711 Unilateral primary osteoarthritis, right knee: Secondary | ICD-10-CM | POA: Diagnosis not present

## 2017-03-25 DIAGNOSIS — D2270 Melanocytic nevi of unspecified lower limb, including hip: Secondary | ICD-10-CM | POA: Diagnosis not present

## 2017-03-25 DIAGNOSIS — L821 Other seborrheic keratosis: Secondary | ICD-10-CM | POA: Diagnosis not present

## 2017-03-25 DIAGNOSIS — L409 Psoriasis, unspecified: Secondary | ICD-10-CM | POA: Diagnosis not present

## 2017-03-25 DIAGNOSIS — D1801 Hemangioma of skin and subcutaneous tissue: Secondary | ICD-10-CM | POA: Diagnosis not present

## 2017-04-23 DIAGNOSIS — H40013 Open angle with borderline findings, low risk, bilateral: Secondary | ICD-10-CM | POA: Diagnosis not present

## 2017-04-23 DIAGNOSIS — H40012 Open angle with borderline findings, low risk, left eye: Secondary | ICD-10-CM | POA: Diagnosis not present

## 2017-05-22 DIAGNOSIS — L259 Unspecified contact dermatitis, unspecified cause: Secondary | ICD-10-CM | POA: Diagnosis not present

## 2017-05-29 DIAGNOSIS — Z1231 Encounter for screening mammogram for malignant neoplasm of breast: Secondary | ICD-10-CM | POA: Diagnosis not present

## 2017-06-09 ENCOUNTER — Other Ambulatory Visit: Payer: Self-pay | Admitting: Family

## 2017-06-09 DIAGNOSIS — E785 Hyperlipidemia, unspecified: Secondary | ICD-10-CM

## 2017-06-09 DIAGNOSIS — M79672 Pain in left foot: Secondary | ICD-10-CM | POA: Diagnosis not present

## 2017-06-09 DIAGNOSIS — M722 Plantar fascial fibromatosis: Secondary | ICD-10-CM | POA: Diagnosis not present

## 2017-07-14 ENCOUNTER — Encounter: Payer: Self-pay | Admitting: Family

## 2017-07-14 ENCOUNTER — Ambulatory Visit (INDEPENDENT_AMBULATORY_CARE_PROVIDER_SITE_OTHER): Payer: BLUE CROSS/BLUE SHIELD | Admitting: Family

## 2017-07-14 VITALS — BP 121/76 | HR 78 | Temp 98.3°F | Ht 68.0 in | Wt 184.8 lb

## 2017-07-14 DIAGNOSIS — M722 Plantar fascial fibromatosis: Secondary | ICD-10-CM

## 2017-07-14 MED ORDER — KETOROLAC TROMETHAMINE 60 MG/2ML IM SOLN: 60.0000 mg | Freq: Once | INTRAMUSCULAR | Status: AC

## 2017-07-14 MED ORDER — PREDNISONE 10 MG (21) PO TBPK: ORAL_TABLET | ORAL | 0 refills | Status: DC

## 2017-07-14 NOTE — Progress Notes (Signed)
   Subjective:    Patient ID: Lori Livingston, female    DOB: June 29, 1956, 61 y.o.   MRN: [id number]  HPI PT presents to the office today with left foot pain. Pt has a heel spur and was Dr. Irving Shows last month and was given steroid injection in her heel. PT states that helped, but over the last few weeks she has started having pain in her arch with shooting, burning pain 8 out 10.  PT states she does a lot of walking this pain is becoming worse.    Review of Systems  Musculoskeletal:       Left foot pain   All other systems reviewed and are negative.      Objective:   Physical Exam  Constitutional: She is oriented to person, place, and time. She appears well-developed and well-nourished. No distress.  HENT:  Head: Normocephalic.  Cardiovascular: Normal rate, regular rhythm, normal heart sounds and intact distal pulses.   No murmur heard. Pulmonary/Chest: Effort normal and breath sounds normal. No respiratory distress. She has no wheezes.  Abdominal: Soft. Bowel sounds are normal. She exhibits no distension. There is no tenderness.  Musculoskeletal: Normal range of motion. She exhibits edema. She exhibits no tenderness.  Neurological: She is alert and oriented to person, place, and time.  Skin: Skin is warm and dry.  Psychiatric: She has a normal mood and affect. Her behavior is normal. Judgment and thought content normal.  Vitals reviewed.   BP 121/76   Pulse 78   Temp 98.3 F (36.8 C) (Oral)   Ht 5\' 8"  (1.727 m)   Wt 184 lb 12.8 oz (83.8 kg)   BMI 28.10 kg/m       Assessment & Plan:  1. Plantar fasciitis Rest Ice ROM exercises discussed Continue Mobic daily Good shoe support RTO prn  - ketorolac (TORADOL) injection 60 mg; Inject 2 mLs (60 mg total) into the muscle once. - predniSONE (STERAPRED UNI-PAK 21 TAB) 10 MG (21) TBPK tablet; Use as directed  Dispense: 21 tablet; Refill: 0   Evelina Dun, FNP

## 2017-07-14 NOTE — Patient Instructions (Signed)

## 2017-08-07 ENCOUNTER — Ambulatory Visit (INDEPENDENT_AMBULATORY_CARE_PROVIDER_SITE_OTHER): Payer: BLUE CROSS/BLUE SHIELD | Admitting: Family

## 2017-08-07 ENCOUNTER — Encounter: Payer: Self-pay | Admitting: Family

## 2017-08-07 VITALS — BP 128/81 | HR 61 | Temp 97.1°F | Ht 68.0 in | Wt 181.0 lb

## 2017-08-07 DIAGNOSIS — M1711 Unilateral primary osteoarthritis, right knee: Secondary | ICD-10-CM | POA: Diagnosis not present

## 2017-08-07 DIAGNOSIS — Z79890 Hormone replacement therapy (postmenopausal): Secondary | ICD-10-CM

## 2017-08-07 DIAGNOSIS — N951 Menopausal and female climacteric states: Secondary | ICD-10-CM | POA: Diagnosis not present

## 2017-08-07 DIAGNOSIS — R252 Cramp and spasm: Secondary | ICD-10-CM | POA: Diagnosis not present

## 2017-08-07 DIAGNOSIS — E785 Hyperlipidemia, unspecified: Secondary | ICD-10-CM | POA: Diagnosis not present

## 2017-08-07 DIAGNOSIS — E559 Vitamin D deficiency, unspecified: Secondary | ICD-10-CM

## 2017-08-07 DIAGNOSIS — E663 Overweight: Secondary | ICD-10-CM

## 2017-08-07 NOTE — Patient Instructions (Signed)
Leg Cramps Leg cramps occur when a muscle or muscles tighten and you have no control over this tightening (involuntary muscle contraction). Muscle cramps can develop in any muscle, but the most common place is in the calf muscles of the leg. Those cramps can occur during exercise or when you are at rest. Leg cramps are painful, and they may last for a few seconds to a few minutes. Cramps may return several times before they finally stop. Usually, leg cramps are not caused by a serious medical problem. In many cases, the cause is not known. Some common causes include:  Overexertion.  Overuse from repetitive motions, or doing the same thing over and over.  Remaining in a certain position for a long period of time.  Improper preparation, form, or technique while performing a sport or an activity.  Dehydration.  Injury.  Side effects of some medicines.  Abnormally low levels of the salts and ions in your blood (electrolytes), especially potassium and calcium. These levels could be low if you are taking water pills (diuretics) or if you are pregnant.  Follow these instructions at home: Watch your condition for any changes. Taking the following actions may help to lessen any discomfort that you are feeling:  Stay well-hydrated. Drink enough fluid to keep your urine clear or pale yellow.  Try massaging, stretching, and relaxing the affected muscle. Do this for several minutes at a time.  For tight or tense muscles, use a warm towel, heating pad, or hot shower water directed to the affected area.  If you are sore or have pain after a cramp, applying ice to the affected area may relieve discomfort. ? Put ice in a plastic bag. ? Place a towel between your skin and the bag. ? Leave the ice on for 20 minutes, 2-3 times per day.  Avoid strenuous exercise for several days if you have been having frequent leg cramps.  Make sure that your diet includes the essential minerals for your muscles to  work normally.  Take medicines only as directed by your health care provider.  Contact a health care provider if:  Your leg cramps get more severe or more frequent, or they do not improve over time.  Your foot becomes cold, numb, or blue. This information is not intended to replace advice given to you by your health care provider. Make sure you discuss any questions you have with your health care provider. Document Released: 01/08/2005 Document Revised: 05/08/2016 Document Reviewed: 11/08/2014 Elsevier Interactive Patient Education  2018 [employer]  

## 2017-08-07 NOTE — Progress Notes (Signed)
   Subjective:    Patient ID: Lori Livingston, female    DOB: 19-May-1956, 61 y.o.   MRN: [id number]  Pt presents to the office today for chronic follow up. PT is followed by gynecologists every year. Pt is on Estrace after her hysterectomy in 2010 for hot flashes.   PT complaining of bilateral leg cramp that is worse in right leg at night. PT states this started several weeks ago and has not changed.  Hyperlipidemia  This is a chronic problem. The current episode started more than 1 year ago. The problem is controlled. Recent lipid tests were reviewed and are normal. Exacerbating diseases include obesity. Current antihyperlipidemic treatment includes statins. The current treatment provides moderate improvement of lipids. Risk factors for coronary artery disease include diabetes mellitus, dyslipidemia, obesity, post-menopausal and a sedentary lifestyle.  Arthritis  Presents for follow-up visit. She complains of pain and stiffness. The symptoms have been stable. Affected locations include the right knee and left knee. Her pain is at a severity of 5/10.      Review of Systems  Musculoskeletal: Positive for arthritis and stiffness.  All other systems reviewed and are negative.      Objective:   Physical Exam  Constitutional: She is oriented to person, place, and time. She appears well-developed and well-nourished. No distress.  HENT:  Head: Normocephalic and atraumatic.  Right Ear: External ear normal.  Left Ear: External ear normal.  Nose: Nose normal.  Mouth/Throat: Oropharynx is clear and moist.  Eyes: Pupils are equal, round, and reactive to light.  Neck: Normal range of motion. Neck supple. No thyromegaly present.  Cardiovascular: Normal rate, regular rhythm, normal heart sounds and intact distal pulses.   No murmur heard. Pulmonary/Chest: Effort normal and breath sounds normal. No respiratory distress. She has no wheezes.  Abdominal: Soft. Bowel sounds are normal. She exhibits no  distension. There is no tenderness.  Musculoskeletal: Normal range of motion. She exhibits no edema or tenderness.  Neurological: She is alert and oriented to person, place, and time.  Skin: Skin is warm and dry.  Psychiatric: She has a normal mood and affect. Her behavior is normal. Judgment and thought content normal.  Vitals reviewed.     BP 128/81   Pulse 61   Temp (!) 97.1 F (36.2 C) (Oral)   Ht _0  (1.727 m)   Wt 181 lb (82.1 kg)   BMI 27.52 kg/m      Assessment & Plan:  1. Arthritis of knee, right - CMP14+EGFR  2. Vitamin D deficiency - CMP14+EGFR  3. Overweight (BMI 25.0-29.9) - CMP14+EGFR  4. Menopausal syndrome on hormone replacement therapy - CMP14+EGFR  5. Hyperlipidemia, unspecified hyperlipidemia type - CMP14+EGFR - Lipid panel  6. Leg cramp  - CMP14+EGFR - Magnesium   Continue all meds Labs pending Health Maintenance reviewed Diet and exercise encouraged RTO 6 months   Evelina Dun, FNP

## 2017-08-08 LAB — LIPID PANEL
CHOL/HDL RATIO: 2.6 ratio (ref 0.0–4.4)
Cholesterol, Total: 158 mg/dL (ref 100–199)
HDL: 61 mg/dL (ref >39)
LDL Calculated: 80 mg/dL (ref 0–99)
TRIGLYCERIDES: 84 mg/dL (ref 0–149)
VLDL Cholesterol Cal: 17 mg/dL (ref 5–40)

## 2017-08-08 LAB — CMP14+EGFR
A/G RATIO: 1.8 (ref 1.2–2.2)
ALBUMIN: 4.6 g/dL (ref 3.6–4.8)
ALK PHOS: 65 IU/L (ref 39–117)
ALT: 13 IU/L (ref 0–32)
AST: 15 IU/L (ref 0–40)
BUN / CREAT RATIO: 26 (ref 12–28)
BUN: 16 mg/dL (ref 8–27)
Bilirubin Total: 1.5 mg/dL — ABNORMAL HIGH (ref 0.0–1.2)
CALCIUM: 10 mg/dL (ref 8.7–10.3)
CO2: 26 mmol/L (ref 20–29)
Chloride: 102 mmol/L (ref 96–106)
Creatinine, Ser: 0.62 mg/dL (ref 0.57–1.00)
GFR calc Af Amer: 113 mL/min/{1.73_m2} (ref >59)
GFR, EST NON AFRICAN AMERICAN: 98 mL/min/{1.73_m2} (ref >59)
GLOBULIN, TOTAL: 2.5 g/dL (ref 1.5–4.5)
Glucose: 97 mg/dL (ref 65–99)
POTASSIUM: 4.4 mmol/L (ref 3.5–5.2)
SODIUM: 142 mmol/L (ref 134–144)
Total Protein: 7.1 g/dL (ref 6.0–8.5)

## 2017-08-08 LAB — MAGNESIUM: Magnesium: 1.9 mg/dL (ref 1.6–2.3)

## 2017-08-29 ENCOUNTER — Other Ambulatory Visit: Payer: Self-pay | Admitting: Family

## 2017-08-29 DIAGNOSIS — E785 Hyperlipidemia, unspecified: Secondary | ICD-10-CM

## 2017-09-02 DIAGNOSIS — E782 Mixed hyperlipidemia: Secondary | ICD-10-CM | POA: Diagnosis not present

## 2017-09-02 DIAGNOSIS — Z139 Encounter for screening, unspecified: Secondary | ICD-10-CM | POA: Diagnosis not present

## 2017-09-02 DIAGNOSIS — Z79899 Other long term (current) drug therapy: Secondary | ICD-10-CM | POA: Diagnosis not present

## 2017-09-14 DIAGNOSIS — E782 Mixed hyperlipidemia: Secondary | ICD-10-CM | POA: Diagnosis not present

## 2017-09-14 DIAGNOSIS — R7301 Impaired fasting glucose: Secondary | ICD-10-CM | POA: Diagnosis not present

## 2017-09-23 DIAGNOSIS — M19031 Primary osteoarthritis, right wrist: Secondary | ICD-10-CM | POA: Diagnosis not present

## 2017-09-23 DIAGNOSIS — M25561 Pain in right knee: Secondary | ICD-10-CM | POA: Diagnosis not present

## 2017-10-21 DIAGNOSIS — M1711 Unilateral primary osteoarthritis, right knee: Secondary | ICD-10-CM | POA: Diagnosis not present

## 2017-10-27 DIAGNOSIS — M1711 Unilateral primary osteoarthritis, right knee: Secondary | ICD-10-CM | POA: Diagnosis not present

## 2017-11-02 DIAGNOSIS — M1711 Unilateral primary osteoarthritis, right knee: Secondary | ICD-10-CM | POA: Diagnosis not present

## 2017-11-19 ENCOUNTER — Other Ambulatory Visit: Payer: Self-pay | Admitting: Family

## 2017-11-19 DIAGNOSIS — E785 Hyperlipidemia, unspecified: Secondary | ICD-10-CM

## 2018-01-19 DIAGNOSIS — R040 Epistaxis: Secondary | ICD-10-CM | POA: Diagnosis not present

## 2018-01-28 ENCOUNTER — Ambulatory Visit: Payer: BLUE CROSS/BLUE SHIELD | Admitting: Family

## 2018-01-28 ENCOUNTER — Encounter: Payer: Self-pay | Admitting: Family

## 2018-01-28 VITALS — BP 118/74 | HR 63 | Temp 97.0°F | Ht 68.0 in | Wt 189.4 lb

## 2018-01-28 DIAGNOSIS — Z0000 Encounter for general adult medical examination without abnormal findings: Secondary | ICD-10-CM

## 2018-01-28 DIAGNOSIS — Z79890 Hormone replacement therapy (postmenopausal): Secondary | ICD-10-CM

## 2018-01-28 DIAGNOSIS — E785 Hyperlipidemia, unspecified: Secondary | ICD-10-CM

## 2018-01-28 DIAGNOSIS — M1711 Unilateral primary osteoarthritis, right knee: Secondary | ICD-10-CM

## 2018-01-28 DIAGNOSIS — N951 Menopausal and female climacteric states: Secondary | ICD-10-CM | POA: Diagnosis not present

## 2018-01-28 DIAGNOSIS — E663 Overweight: Secondary | ICD-10-CM | POA: Diagnosis not present

## 2018-01-28 DIAGNOSIS — E559 Vitamin D deficiency, unspecified: Secondary | ICD-10-CM | POA: Diagnosis not present

## 2018-01-28 NOTE — Progress Notes (Signed)
   Subjective:    Patient ID: Lori Livingston, female    DOB: 03/30/1956, 62 y.o.   MRN: [id number]  Pt presents to the office today for CPE without pap. PT is followed by gynecologists every year. Pt is on Estrace after her hysterectomy in 2010 for hot flashes.  Hyperlipidemia  This is a chronic problem. The current episode started more than 1 year ago. The problem is controlled. Recent lipid tests were reviewed and are normal. Current antihyperlipidemic treatment includes statins. The current treatment provides moderate improvement of lipids. Risk factors for coronary artery disease include dyslipidemia, obesity and a sedentary lifestyle.  Arthritis  Presents for follow-up visit. She complains of pain and stiffness. The symptoms have been stable. Affected locations include the right knee. Her pain is at a severity of 8/10.      Review of Systems  Musculoskeletal: Positive for arthritis and stiffness.  All other systems reviewed and are negative.      Objective:   Physical Exam  Constitutional: She is oriented to person, place, and time. She appears well-developed and well-nourished. No distress.  HENT:  Head: Normocephalic and atraumatic.  Right Ear: External ear normal.  Left Ear: External ear normal.  Nose: Nose normal.  Mouth/Throat: Oropharynx is clear and moist.  Eyes: Pupils are equal, round, and reactive to light.  Neck: Normal range of motion. Neck supple. No thyromegaly present.  Cardiovascular: Normal rate, regular rhythm, normal heart sounds and intact distal pulses.  No murmur heard. Pulmonary/Chest: Effort normal and breath sounds normal. No respiratory distress. She has no wheezes.  Abdominal: Soft. Bowel sounds are normal. She exhibits no distension. There is no tenderness.  Musculoskeletal: Normal range of motion. She exhibits no edema or tenderness.  Neurological: She is alert and oriented to person, place, and time.  Skin: Skin is warm and dry.  Psychiatric:  She has a normal mood and affect. Her behavior is normal. Judgment and thought content normal.  Vitals reviewed.    BP 118/74   Pulse 63   Temp (!) 97 F (36.1 C) (Oral)   Ht _0  (1.727 m)   Wt 189 lb 6.4 oz (85.9 kg)   BMI 28.80 kg/m      Assessment & Plan:  1. Hyperlipidemia, unspecified hyperlipidemia type - CMP14+EGFR - Lipid panel  2. Vitamin D deficiency - CMP14+EGFR - VITAMIN D 25 Hydroxy (Vit-D Deficiency, Fractures)  3. Menopausal syndrome on hormone replacement therapy - CMP14+EGFR  4. Overweight (BMI 25.0-29.9) - CMP14+EGFR  5. Arthritis of knee, right - CMP14+EGFR  6. Annual physical exam - Anemia Profile B - CMP14+EGFR - Lipid panel - TSH - VITAMIN D 25 Hydroxy (Vit-D Deficiency, Fractures) - Hepatitis C antibody   Continue all meds Labs pending Health Maintenance reviewed Diet and exercise encouraged RTO 6 months   Evelina Dun, FNP

## 2018-01-28 NOTE — Patient Instructions (Signed)

## 2018-01-30 LAB — CMP14+EGFR
ALT: 19 IU/L (ref 0–32)
AST: 18 IU/L (ref 0–40)
Albumin/Globulin Ratio: 1.7 (ref 1.2–2.2)
Albumin: 4.5 g/dL (ref 3.6–4.8)
Alkaline Phosphatase: 78 IU/L (ref 39–117)
BUN/Creatinine Ratio: 25 (ref 12–28)
BUN: 14 mg/dL (ref 8–27)
Bilirubin Total: 0.9 mg/dL (ref 0.0–1.2)
CALCIUM: 9.8 mg/dL (ref 8.7–10.3)
CO2: 22 mmol/L (ref 20–29)
CREATININE: 0.57 mg/dL (ref 0.57–1.00)
Chloride: 102 mmol/L (ref 96–106)
GFR calc Af Amer: 116 mL/min/{1.73_m2} (ref >59)
GFR, EST NON AFRICAN AMERICAN: 100 mL/min/{1.73_m2} (ref >59)
GLOBULIN, TOTAL: 2.6 g/dL (ref 1.5–4.5)
Glucose: 112 mg/dL — ABNORMAL HIGH (ref 65–99)
Potassium: 4.4 mmol/L (ref 3.5–5.2)
SODIUM: 144 mmol/L (ref 134–144)
Total Protein: 7.1 g/dL (ref 6.0–8.5)

## 2018-01-30 LAB — VITAMIN D 25 HYDROXY (VIT D DEFICIENCY, FRACTURES): VIT D 25 HYDROXY: 38.3 ng/mL (ref 30.0–100.0)

## 2018-01-30 LAB — ANEMIA PROFILE B
Basophils Absolute: 0 10*3/uL (ref 0.0–0.2)
Basos: 1 %
EOS (ABSOLUTE): 0.3 10*3/uL (ref 0.0–0.4)
EOS: 4 %
Ferritin: 242 ng/mL — ABNORMAL HIGH (ref 15–150)
HEMOGLOBIN: 14.7 g/dL (ref 11.1–15.9)
Hematocrit: 43.9 % (ref 34.0–46.6)
IMMATURE GRANULOCYTES: 0 %
IRON: 116 ug/dL (ref 27–139)
Immature Grans (Abs): 0 10*3/uL (ref 0.0–0.1)
Iron Saturation: 35 % (ref 15–55)
LYMPHS ABS: 3.1 10*3/uL (ref 0.7–3.1)
Lymphs: 40 %
MCH: 31.4 pg (ref 26.6–33.0)
MCHC: 33.5 g/dL (ref 31.5–35.7)
MCV: 94 fL (ref 79–97)
Monocytes Absolute: 0.6 10*3/uL (ref 0.1–0.9)
Monocytes: 8 %
NEUTROS ABS: 3.7 10*3/uL (ref 1.4–7.0)
Neutrophils: 47 %
Platelets: 390 10*3/uL — ABNORMAL HIGH (ref 150–379)
RBC: 4.68 x10E6/uL (ref 3.77–5.28)
RDW: 13.7 % (ref 12.3–15.4)
Retic Ct Pct: 1.9 % (ref 0.6–2.6)
TIBC: 335 ug/dL (ref 250–450)
UIBC: 219 ug/dL (ref 118–369)
WBC: 7.7 10*3/uL (ref 3.4–10.8)

## 2018-01-30 LAB — TSH: TSH: 2.85 u[IU]/mL (ref 0.450–4.500)

## 2018-01-30 LAB — LIPID PANEL
Chol/HDL Ratio: 3.9 ratio (ref 0.0–4.4)
Cholesterol, Total: 156 mg/dL (ref 100–199)
HDL: 40 mg/dL (ref >39)
LDL CALC: 74 mg/dL (ref 0–99)
TRIGLYCERIDES: 210 mg/dL — AB (ref 0–149)
VLDL CHOLESTEROL CAL: 42 mg/dL — AB (ref 5–40)

## 2018-01-30 LAB — HEPATITIS C ANTIBODY: Hep C Virus Ab: <0.1 s/co ratio (ref 0.0–0.9)

## 2018-02-01 ENCOUNTER — Other Ambulatory Visit: Payer: Self-pay | Admitting: Family

## 2018-02-01 DIAGNOSIS — E785 Hyperlipidemia, unspecified: Secondary | ICD-10-CM

## 2018-02-11 ENCOUNTER — Ambulatory Visit: Payer: BLUE CROSS/BLUE SHIELD | Admitting: Family

## 2018-02-11 DIAGNOSIS — S93602A Unspecified sprain of left foot, initial encounter: Secondary | ICD-10-CM | POA: Diagnosis not present

## 2018-02-11 DIAGNOSIS — M19071 Primary osteoarthritis, right ankle and foot: Secondary | ICD-10-CM | POA: Diagnosis not present

## 2018-03-02 DIAGNOSIS — S92345A Nondisplaced fracture of fourth metatarsal bone, left foot, initial encounter for closed fracture: Secondary | ICD-10-CM | POA: Diagnosis not present

## 2018-03-10 DIAGNOSIS — Z719 Counseling, unspecified: Secondary | ICD-10-CM | POA: Diagnosis not present

## 2018-03-10 DIAGNOSIS — Z008 Encounter for other general examination: Secondary | ICD-10-CM | POA: Diagnosis not present

## 2018-03-10 DIAGNOSIS — E782 Mixed hyperlipidemia: Secondary | ICD-10-CM | POA: Diagnosis not present

## 2018-03-10 DIAGNOSIS — R7301 Impaired fasting glucose: Secondary | ICD-10-CM | POA: Diagnosis not present

## 2018-03-16 DIAGNOSIS — Z6829 Body mass index (BMI) 29.0-29.9, adult: Secondary | ICD-10-CM | POA: Diagnosis not present

## 2018-03-16 DIAGNOSIS — M545 Low back pain: Secondary | ICD-10-CM | POA: Diagnosis not present

## 2018-03-16 DIAGNOSIS — Z01411 Encounter for gynecological examination (general) (routine) with abnormal findings: Secondary | ICD-10-CM | POA: Diagnosis not present

## 2018-03-23 DIAGNOSIS — R319 Hematuria, unspecified: Secondary | ICD-10-CM | POA: Diagnosis not present

## 2018-03-29 DIAGNOSIS — S92345D Nondisplaced fracture of fourth metatarsal bone, left foot, subsequent encounter for fracture with routine healing: Secondary | ICD-10-CM | POA: Diagnosis not present

## 2018-03-30 DIAGNOSIS — L821 Other seborrheic keratosis: Secondary | ICD-10-CM | POA: Diagnosis not present

## 2018-03-30 DIAGNOSIS — D1801 Hemangioma of skin and subcutaneous tissue: Secondary | ICD-10-CM | POA: Diagnosis not present

## 2018-03-30 DIAGNOSIS — D2270 Melanocytic nevi of unspecified lower limb, including hip: Secondary | ICD-10-CM | POA: Diagnosis not present

## 2018-03-30 DIAGNOSIS — L249 Irritant contact dermatitis, unspecified cause: Secondary | ICD-10-CM | POA: Diagnosis not present

## 2018-04-13 DIAGNOSIS — R3121 Asymptomatic microscopic hematuria: Secondary | ICD-10-CM | POA: Diagnosis not present

## 2018-05-31 DIAGNOSIS — Z1231 Encounter for screening mammogram for malignant neoplasm of breast: Secondary | ICD-10-CM | POA: Diagnosis not present

## 2018-06-10 DIAGNOSIS — R922 Inconclusive mammogram: Secondary | ICD-10-CM | POA: Diagnosis not present

## 2018-06-10 DIAGNOSIS — N6001 Solitary cyst of right breast: Secondary | ICD-10-CM | POA: Diagnosis not present

## 2018-06-10 DIAGNOSIS — R928 Other abnormal and inconclusive findings on diagnostic imaging of breast: Secondary | ICD-10-CM | POA: Diagnosis not present

## 2018-06-24 DIAGNOSIS — M84375D Stress fracture, left foot, subsequent encounter for fracture with routine healing: Secondary | ICD-10-CM | POA: Diagnosis not present

## 2018-07-12 DIAGNOSIS — R3121 Asymptomatic microscopic hematuria: Secondary | ICD-10-CM | POA: Diagnosis not present

## 2018-07-12 DIAGNOSIS — R1011 Right upper quadrant pain: Secondary | ICD-10-CM | POA: Diagnosis not present

## 2018-07-12 DIAGNOSIS — M84375D Stress fracture, left foot, subsequent encounter for fracture with routine healing: Secondary | ICD-10-CM | POA: Diagnosis not present

## 2018-07-26 DIAGNOSIS — M84375D Stress fracture, left foot, subsequent encounter for fracture with routine healing: Secondary | ICD-10-CM | POA: Diagnosis not present

## 2018-08-06 ENCOUNTER — Ambulatory Visit: Payer: BLUE CROSS/BLUE SHIELD | Admitting: Family

## 2018-08-09 DIAGNOSIS — R3129 Other microscopic hematuria: Secondary | ICD-10-CM | POA: Diagnosis not present

## 2018-08-09 DIAGNOSIS — R3121 Asymptomatic microscopic hematuria: Secondary | ICD-10-CM | POA: Diagnosis not present

## 2018-08-10 ENCOUNTER — Ambulatory Visit: Payer: BLUE CROSS/BLUE SHIELD | Admitting: Family

## 2018-08-10 ENCOUNTER — Encounter: Payer: Self-pay | Admitting: Family

## 2018-08-10 VITALS — BP 110/66 | HR 69 | Temp 97.4°F | Ht 68.0 in | Wt 187.6 lb

## 2018-08-10 DIAGNOSIS — Z79890 Hormone replacement therapy (postmenopausal): Secondary | ICD-10-CM | POA: Diagnosis not present

## 2018-08-10 DIAGNOSIS — M1711 Unilateral primary osteoarthritis, right knee: Secondary | ICD-10-CM | POA: Diagnosis not present

## 2018-08-10 DIAGNOSIS — E663 Overweight: Secondary | ICD-10-CM | POA: Diagnosis not present

## 2018-08-10 DIAGNOSIS — E559 Vitamin D deficiency, unspecified: Secondary | ICD-10-CM | POA: Diagnosis not present

## 2018-08-10 DIAGNOSIS — E785 Hyperlipidemia, unspecified: Secondary | ICD-10-CM | POA: Diagnosis not present

## 2018-08-10 DIAGNOSIS — N951 Menopausal and female climacteric states: Secondary | ICD-10-CM

## 2018-08-10 DIAGNOSIS — J0100 Acute maxillary sinusitis, unspecified: Secondary | ICD-10-CM

## 2018-08-10 MED ORDER — AMOXICILLIN-POT CLAVULANATE 875-125 MG PO TABS
1.0000 | ORAL_TABLET | Freq: Two times a day (BID) | ORAL | 0 refills | Status: DC
Start: 2018-08-10 — End: 2018-11-03

## 2018-08-10 NOTE — Progress Notes (Signed)
Subjective:    Patient ID: Lori Livingston, female    DOB: 1956-04-02, 62 y.o.   MRN: [id number]  Chief Complaint  Patient presents with  . Medical Management of Chronic Issues   PT presents to the office today for chronic follow up. PT is followed by gynecologists every year. Pt is on Estrace after her hysterectomy in 2010 for hot flashes. Hyperlipidemia  This is a chronic problem. The current episode started more than 1 year ago. The problem is controlled. Recent lipid tests were reviewed and are normal. Current antihyperlipidemic treatment includes statins. The current treatment provides moderate improvement of lipids. Risk factors for coronary artery disease include dyslipidemia and a sedentary lifestyle.  Arthritis  Presents for follow-up visit. She complains of pain and stiffness. The symptoms have been stable. Affected locations include the right knee. Her pain is at a severity of 8/10.  Sinusitis  This is a new problem. The current episode started in the past 7 days. The problem has been waxing and waning since onset. There has been no fever. Her pain is at a severity of 3/10. The pain is mild. Associated symptoms include congestion, coughing, a hoarse voice, sinus pressure and a sore throat. Past treatments include oral decongestants. The treatment provided mild relief.      Review of Systems  HENT: Positive for congestion, hoarse voice, sinus pressure and sore throat.   Respiratory: Positive for cough.   Musculoskeletal: Positive for arthritis and stiffness.  All other systems reviewed and are negative.      Objective:   Physical Exam  Constitutional: She is oriented to person, place, and time. She appears well-developed and well-nourished. No distress.  HENT:  Head: Normocephalic and atraumatic.  Right Ear: External ear normal.  Left Ear: External ear normal.  Mouth/Throat: Posterior oropharyngeal erythema present.  Eyes: Pupils are equal, round, and reactive to  light.  Neck: Normal range of motion. Neck supple. No thyromegaly present.  Cardiovascular: Normal rate, regular rhythm, normal heart sounds and intact distal pulses.  No murmur heard. Pulmonary/Chest: Effort normal and breath sounds normal. No respiratory distress. She has no wheezes.  Dry cough  Abdominal: Soft. Bowel sounds are normal. She exhibits no distension. There is no tenderness.  Musculoskeletal: Normal range of motion. She exhibits no edema or tenderness.  Neurological: She is alert and oriented to person, place, and time. She has normal reflexes. No cranial nerve deficit.  Skin: Skin is warm and dry.  Psychiatric: She has a normal mood and affect. Her behavior is normal. Judgment and thought content normal.  Vitals reviewed.     BP 110/66   Pulse 69   Temp (!) 97.4 F (36.3 C) (Oral)   Ht 5' 8"  (1.727 m)   Wt 187 lb 9.6 oz (85.1 kg)   BMI 28.52 kg/m      Assessment & Plan:  Lori Livingston comes in today with chief complaint of Medical Management of Chronic Issues   Diagnosis and orders addressed:  1. Hyperlipidemia, unspecified hyperlipidemia type - CBC with Differential/Platelet - CMP14+EGFR - Lipid panel  2. Arthritis of knee, right - CBC with Differential/Platelet - CMP14+EGFR  3. Overweight (BMI 25.0-29.9) - CBC with Differential/Platelet - CMP14+EGFR  4. Vitamin D deficiency - CBC with Differential/Platelet - CMP14+EGFR - VITAMIN D 25 Hydroxy (Vit-D Deficiency, Fractures)  5. Menopausal syndrome on hormone replacement therapy - CBC with Differential/Platelet - CMP14+EGFR  6. Acute maxillary sinusitis, recurrence not specified - Take meds as prescribed - Use  a cool mist humidifier  -Use saline nose sprays frequently -Force fluids -For any cough or congestion  Use plain Mucinex- regular strength or max strength is fine -For fever or aces or pains- take tylenol or ibuprofen. -Throat lozenges if help - amoxicillin-clavulanate (AUGMENTIN)  875-125 MG tablet; Take 1 tablet by mouth 2 (two) times daily.  Dispense: 14 tablet; Refill: 0   Labs pending Health Maintenance reviewed Diet and exercise encouraged  Follow up plan: 6 months    Lori Dun, FNP

## 2018-08-10 NOTE — Patient Instructions (Signed)

## 2018-08-11 LAB — CBC WITH DIFFERENTIAL/PLATELET
BASOS: 1 %
Basophils Absolute: 0.1 10*3/uL (ref 0.0–0.2)
EOS (ABSOLUTE): 0.3 10*3/uL (ref 0.0–0.4)
EOS: 3 %
HEMATOCRIT: 41.4 % (ref 34.0–46.6)
HEMOGLOBIN: 14.3 g/dL (ref 11.1–15.9)
Immature Grans (Abs): 0 10*3/uL (ref 0.0–0.1)
Immature Granulocytes: 0 %
LYMPHS ABS: 3.2 10*3/uL — AB (ref 0.7–3.1)
Lymphs: 37 %
MCH: 32.1 pg (ref 26.6–33.0)
MCHC: 34.5 g/dL (ref 31.5–35.7)
MCV: 93 fL (ref 79–97)
MONOCYTES: 9 %
Monocytes Absolute: 0.7 10*3/uL (ref 0.1–0.9)
NEUTROS ABS: 4.3 10*3/uL (ref 1.4–7.0)
Neutrophils: 50 %
Platelets: 343 10*3/uL (ref 150–450)
RBC: 4.45 x10E6/uL (ref 3.77–5.28)
RDW: 13 % (ref 12.3–15.4)
WBC: 8.6 10*3/uL (ref 3.4–10.8)

## 2018-08-11 LAB — CMP14+EGFR
ALBUMIN: 4.3 g/dL (ref 3.6–4.8)
ALK PHOS: 66 IU/L (ref 39–117)
ALT: 15 IU/L (ref 0–32)
AST: 17 IU/L (ref 0–40)
Albumin/Globulin Ratio: 1.8 (ref 1.2–2.2)
BILIRUBIN TOTAL: 0.8 mg/dL (ref 0.0–1.2)
BUN / CREAT RATIO: 15 (ref 12–28)
BUN: 9 mg/dL (ref 8–27)
CHLORIDE: 105 mmol/L (ref 96–106)
CO2: 26 mmol/L (ref 20–29)
CREATININE: 0.62 mg/dL (ref 0.57–1.00)
Calcium: 9.6 mg/dL (ref 8.7–10.3)
GFR calc non Af Amer: 97 mL/min/{1.73_m2} (ref >59)
GFR, EST AFRICAN AMERICAN: 112 mL/min/{1.73_m2} (ref >59)
GLOBULIN, TOTAL: 2.4 g/dL (ref 1.5–4.5)
Glucose: 125 mg/dL — ABNORMAL HIGH (ref 65–99)
Potassium: 3.9 mmol/L (ref 3.5–5.2)
SODIUM: 144 mmol/L (ref 134–144)
TOTAL PROTEIN: 6.7 g/dL (ref 6.0–8.5)

## 2018-08-11 LAB — LIPID PANEL
CHOLESTEROL TOTAL: 171 mg/dL (ref 100–199)
Chol/HDL Ratio: 4.2 ratio (ref 0.0–4.4)
HDL: 41 mg/dL (ref >39)
LDL CALC: 91 mg/dL (ref 0–99)
Triglycerides: 195 mg/dL — ABNORMAL HIGH (ref 0–149)
VLDL Cholesterol Cal: 39 mg/dL (ref 5–40)

## 2018-08-11 LAB — VITAMIN D 25 HYDROXY (VIT D DEFICIENCY, FRACTURES): VIT D 25 HYDROXY: 51.7 ng/mL (ref 30.0–100.0)

## 2018-08-13 DIAGNOSIS — H109 Unspecified conjunctivitis: Secondary | ICD-10-CM | POA: Diagnosis not present

## 2018-08-13 LAB — SPECIMEN STATUS REPORT

## 2018-08-13 LAB — HGB A1C W/O EAG: HEMOGLOBIN A1C: 5.8 % — AB (ref 4.8–5.6)

## 2018-08-19 DIAGNOSIS — M79672 Pain in left foot: Secondary | ICD-10-CM | POA: Diagnosis not present

## 2018-08-23 DIAGNOSIS — R7301 Impaired fasting glucose: Secondary | ICD-10-CM | POA: Diagnosis not present

## 2018-08-23 DIAGNOSIS — Z139 Encounter for screening, unspecified: Secondary | ICD-10-CM | POA: Diagnosis not present

## 2018-08-23 DIAGNOSIS — Z0130 Encounter for examination of blood pressure without abnormal findings: Secondary | ICD-10-CM | POA: Diagnosis not present

## 2018-08-23 DIAGNOSIS — E559 Vitamin D deficiency, unspecified: Secondary | ICD-10-CM | POA: Diagnosis not present

## 2018-08-23 DIAGNOSIS — Z79899 Other long term (current) drug therapy: Secondary | ICD-10-CM | POA: Diagnosis not present

## 2018-08-23 DIAGNOSIS — E782 Mixed hyperlipidemia: Secondary | ICD-10-CM | POA: Diagnosis not present

## 2018-09-09 DIAGNOSIS — Z23 Encounter for immunization: Secondary | ICD-10-CM | POA: Diagnosis not present

## 2018-09-15 DIAGNOSIS — Z719 Counseling, unspecified: Secondary | ICD-10-CM | POA: Diagnosis not present

## 2018-09-15 DIAGNOSIS — R7301 Impaired fasting glucose: Secondary | ICD-10-CM | POA: Diagnosis not present

## 2018-09-15 DIAGNOSIS — Z008 Encounter for other general examination: Secondary | ICD-10-CM | POA: Diagnosis not present

## 2018-09-15 DIAGNOSIS — E782 Mixed hyperlipidemia: Secondary | ICD-10-CM | POA: Diagnosis not present

## 2018-09-16 DIAGNOSIS — M1711 Unilateral primary osteoarthritis, right knee: Secondary | ICD-10-CM | POA: Diagnosis not present

## 2018-10-01 DIAGNOSIS — M1711 Unilateral primary osteoarthritis, right knee: Secondary | ICD-10-CM | POA: Diagnosis not present

## 2018-10-28 ENCOUNTER — Other Ambulatory Visit: Payer: Self-pay | Admitting: Family

## 2018-10-28 DIAGNOSIS — Z01818 Encounter for other preprocedural examination: Secondary | ICD-10-CM | POA: Diagnosis not present

## 2018-10-28 DIAGNOSIS — M1711 Unilateral primary osteoarthritis, right knee: Secondary | ICD-10-CM | POA: Diagnosis not present

## 2018-11-01 DIAGNOSIS — Z7189 Other specified counseling: Secondary | ICD-10-CM | POA: Diagnosis not present

## 2018-11-03 ENCOUNTER — Encounter: Payer: Self-pay | Admitting: Family

## 2018-11-03 ENCOUNTER — Ambulatory Visit: Payer: BLUE CROSS/BLUE SHIELD | Admitting: Family

## 2018-11-03 ENCOUNTER — Ambulatory Visit (INDEPENDENT_AMBULATORY_CARE_PROVIDER_SITE_OTHER): Payer: BLUE CROSS/BLUE SHIELD

## 2018-11-03 VITALS — BP 131/77 | HR 60 | Temp 97.2°F | Ht 68.0 in | Wt 185.0 lb

## 2018-11-03 DIAGNOSIS — M1711 Unilateral primary osteoarthritis, right knee: Secondary | ICD-10-CM

## 2018-11-03 DIAGNOSIS — Z01818 Encounter for other preprocedural examination: Secondary | ICD-10-CM

## 2018-11-03 DIAGNOSIS — M47814 Spondylosis without myelopathy or radiculopathy, thoracic region: Secondary | ICD-10-CM | POA: Diagnosis not present

## 2018-11-03 LAB — BMP8+EGFR
BUN/Creatinine Ratio: 20 (ref 12–28)
BUN: 13 mg/dL (ref 8–27)
CALCIUM: 10 mg/dL (ref 8.7–10.3)
CO2: 25 mmol/L (ref 20–29)
CREATININE: 0.64 mg/dL (ref 0.57–1.00)
Chloride: 102 mmol/L (ref 96–106)
GFR calc Af Amer: 111 mL/min/{1.73_m2} (ref >59)
GFR calc non Af Amer: 96 mL/min/{1.73_m2} (ref >59)
GLUCOSE: 92 mg/dL (ref 65–99)
Potassium: 4.1 mmol/L (ref 3.5–5.2)
Sodium: 140 mmol/L (ref 134–144)

## 2018-11-03 LAB — CBC WITH DIFFERENTIAL/PLATELET
BASOS ABS: 0.1 10*3/uL (ref 0.0–0.2)
Basos: 1 %
EOS (ABSOLUTE): 0.3 10*3/uL (ref 0.0–0.4)
Eos: 4 %
Hematocrit: 43.3 % (ref 34.0–46.6)
Hemoglobin: 14.7 g/dL (ref 11.1–15.9)
IMMATURE GRANS (ABS): 0 10*3/uL (ref 0.0–0.1)
Immature Granulocytes: 0 %
LYMPHS: 44 %
Lymphocytes Absolute: 3.6 10*3/uL — ABNORMAL HIGH (ref 0.7–3.1)
MCH: 32 pg (ref 26.6–33.0)
MCHC: 33.9 g/dL (ref 31.5–35.7)
MCV: 94 fL (ref 79–97)
Monocytes Absolute: 0.8 10*3/uL (ref 0.1–0.9)
Monocytes: 9 %
Neutrophils Absolute: 3.5 10*3/uL (ref 1.4–7.0)
Neutrophils: 42 %
Platelets: 367 10*3/uL (ref 150–450)
RBC: 4.59 x10E6/uL (ref 3.77–5.28)
RDW: 12.1 % — ABNORMAL LOW (ref 12.3–15.4)
WBC: 8.3 10*3/uL (ref 3.4–10.8)

## 2018-11-03 NOTE — Patient Instructions (Signed)
Preparing for Knee Replacement Getting prepared before knee replacement surgery can make your recovery easier and more comfortable. This document provides some tips and guidelines that will help you prepare for your surgery. You can ease any concerns about your financial responsibilities by calling your insurance company after you decide to have surgery. In addition to asking about your surgery and hospital stay, you will want to ask about coverage for medical equipment, rehabilitation facilities, and home care. How should I arrange for help? You will be stronger and more mobile every day. However, in the first couple of weeks after surgery, it is unlikely that you will be able to do all your daily activities as easily as you did before your surgery. You may tire easily and will still have limited movement in your leg. Follow these guidelines to best arrange for the help you may need after your surgery:  Plan to have someone take you home after the procedure. Your health care provider will tell you how many days you can expect to be in the hospital.  Cancel all work, caregiving, and volunteer responsibilities for at least 4-6 weeks after your surgery.  If you live alone, arrange for someone to take care of your home and pets for the first 4-6 weeks after surgery.  Plan to have someone stay with you day and night for the first week. This person should be someone you are comfortable with. You may need this person to help you with your exercises and personal care, such as bathing and using the toilet.  Arrange for drivers to take you to and from your follow-up appointments, the grocery store, and other places you may need to go for at least 4-6 weeks.  How should I prepare my home?  Pick a recovery spot, but do not plan on recovering in bed. Sitting upright is better for your health. You may want to use a recliner with a small table nearby. Place the items you use most frequently on that table. These  may include the TV remote, a cordless phone, a cell phone, a book or laptop computer, a water glass, and any other items of your choice.  Remove all clutter from your floors. Also remove any throw rugs.  To see if you will be able to move in your home with a walker, hold your hands out about 6 inches (15 cm) from your sides. Walk from your recovery spot to your kitchen and bathroom. Then walk from your bed to the bathroom. If you do not hit anything with your hands, you'll know that you have enough room for a walker.  Move the items that you use most often from your kitchen, bathroom, and bedroom to shelves and drawers that are at countertop height.  Prepare a few meals to freeze and reheat later.  Consider adding grab bars in the shower and near the toilet.  While you are in the hospital, you will learn about equipment that can be helpful for your recovery. Equipment may include raised toilet seats, tub benches, and shower benches. How should I prepare my body?  Have a preoperative exam. ? During the exam, your health care provider will make sure that your body is healthy enough to safely have this surgery. ? To your exam, take a complete list of all your medicines and supplements, including herbs and vitamins. ? You may need to have additional tests to ensure your safety.  Have elective dental care and routine cleanings completed before your surgery. Germs   from anywhere in your body, including your mouth, can travel to your new joint and infect it. It is important that you do not have any dental work performed for at least 3 months after your surgery. After surgery, be sure to tell your dentist about your joint replacement.  Maintain a healthy diet. Do not change your diet before surgery unless your health care provider advised you to do that.  Do not use any products that contain nicotine or tobacco, such as cigarettes or e-cigarettes. Tobacco and nicotine can delay bone healing. If you  need help quitting, ask your health care provider.  The day before your surgery, follow instructions from your health care provider for showering, eating, drinking, and taking medicines. These directions are for your safety. What kinds of exercises should I do? Your health care provider may have you do the following exercises before your surgery. Be sure to follow the exercise program only as directed by your health care provider. You should not feel pain while performing these exercises. Ankle pumps ( Dorsiflexion/plantar flexion) 1. Sit on a firm surface with your __________ leg straight out in front of you. Do not rest your foot on anything. 2. Move your foot at the ankle joint to tilt the top of your foot toward your shin. 3. Hold this position for __________ seconds. 4. Reverse the motion by tilting the top of your foot away from your shin. 5. Hold this position for __________ seconds. Repeat __________ times, then do the exercise with your other leg. Complete this exercise __________ times a day. Heel slides ( Knee flexion) 1. Lie on your back with both knees straight. (If this causes back discomfort, bend your healthy knee so your foot is flat on the floor. Keep this leg in this position while doing heel slides with the other leg.) 2. Slowly slide your __________ heel back toward your buttocks until you feel a gentle stretch in the front of your knee or thigh. 3. Hold this position for __________ seconds. 4. Slowly slide your __________ heel to the starting position. Repeat __________ times, then do the exercise with your other leg. Complete this exercise __________ times a day. Quadriceps, isometric 1. Lie on your back with your __________ leg extended and your other knee bent. 2. Gradually tighten the muscles in the front of your __________ thigh. This motion will push the back of your knee down toward the surface that is under it. 3. For __________ seconds, keep the muscles as tight  as you can without causing or increasing pain. 4. Relax the muscles slowly and completely after each repetition. Repeat __________ times, then do the exercise with your other leg. Complete this exercise __________ times a day. Short arc kicks 1. Lie on your back. Place a rolled towel (about 4-6 inches [10-15 cm] in diameter) under one knee so the knee is slightly bent. 2. Tighten the muscles in the front of your __________ thigh to raise only the lower part of your slightly bent leg off the floor. Do not allow your thigh to rise. 3. Hold this position for __________ seconds. Repeat __________ times, then do the exercise with your other leg. Complete this exercise __________ times a day. Straight leg raises - quadriceps 1. Lie on your back with your __________ leg extended and your other knee bent. 2. Tighten the muscles in the front of your __________ thigh. Your thigh may shake slightly. 3. Tighten these muscles even more to raise your leg 4-6 inches (10-15 cm)   off the floor. 4. Hold this position for __________ seconds. 5. Keep these muscles tight as you lower your leg. 6. Relax the muscles slowly and completely after each repetition. Repeat __________ times, then do the exercise with your other leg. Complete this exercise __________ times a day. Hamstring curls, prone If told by your health care provider, do this exercise while wearing ankle weights. Begin with __________ weights. Then increase the weight by 1 lb (0.5 kg) increments. Do not wear ankle weights that are more than __________. 1. Lie on your abdomen with your legs straight. 2. Bend your __________ knee as far as you can comfortably do that. Keep your hips flat against the surface that is under them. When you bend your knee, bring your foot straight toward your buttock. Do not let it fall in or out. 3. Hold this position for __________ seconds. 4. Slowly lower your leg to the starting position.  Repeat __________ times, then do  the exercise with your other leg. Complete this exercise __________ times a day. Armchair push-ups 1. Find a firm, non-wheeled chair that has solid armrests. 2. Sitting in the chair, extend your __________ leg straight out in front of you. 3. Use your arms and your other leg to lift your body weight off of the seat of the chair. 4. Slowly lower your body weight to the seat. Repeat __________ times, then do the exercise with your other leg. Complete this exercise __________ times a day. This information is not intended to replace advice given to you by your health care provider. Make sure you discuss any questions you have with your health care provider. Document Released: 03/07/2011 Document Revised: 01/03/2017 Document Reviewed: 02/23/2014 [employer] Interactive Patient Education  2018 [employer]  

## 2018-11-03 NOTE — Progress Notes (Signed)
   Subjective:    Patient ID: Lori Livingston, female    DOB: [birth year], 62 y.o.   MRN: [id number]  Chief Complaint  Patient presents with  . surgical clearance    HPI Pt presents to the office today for surgical clearance right partial knee replacement on 11/25/18. States she has intermittent aching pain of 7 out 10 that is worse when she stands and walks on it. Reports mild swelling. She takes mobic daily. She will hold this one week prior to surgery.   Denies any hx of cardiovascular disease.    Review of Systems  All other systems reviewed and are negative.      Objective:   Physical Exam  Constitutional: She is oriented to person, place, and time. She appears well-developed and well-nourished. No distress.  HENT:  Head: Normocephalic and atraumatic.  Right Ear: External ear normal.  Left Ear: External ear normal.  Mouth/Throat: Oropharynx is clear and moist.  Eyes: Pupils are equal, round, and reactive to light.  Neck: Normal range of motion. Neck supple. No thyromegaly present.  Cardiovascular: Normal rate, regular rhythm, normal heart sounds and intact distal pulses.  No murmur heard. Pulmonary/Chest: Effort normal and breath sounds normal. No respiratory distress. She has no wheezes.  Abdominal: Soft. Bowel sounds are normal. She exhibits no distension. There is no tenderness.  Musculoskeletal: She exhibits edema. She exhibits no tenderness.  Trace swelling in right knee, pain in flexion and extension   Neurological: She is alert and oriented to person, place, and time. She has normal reflexes. No cranial nerve deficit.  Skin: Skin is warm and dry.  Psychiatric: She has a normal mood and affect. Her behavior is normal. Judgment and thought content normal.  Vitals reviewed.    BP 131/77   Pulse 60   Temp (!) 97.2 F (36.2 C) (Oral)   Ht '5\' 8"'$  (1.727 m)   Wt 185 lb (83.9 kg)   BMI 28.13 kg/m      Assessment & Plan:  Lori Livingston comes in today with chief  complaint of surgical clearance   Diagnosis and orders addressed:  1. Pre-op evaluation - EKG 12-Lead - DG Chest 2 View; Future - CBC with Differential/Platelet - BMP8+EGFR  2. Arthritis of knee, right - CBC with Differential/Platelet - BMP8+EGFR    Follow up plan: Keep Ortho and chronic follow up  Evelina Dun, FNP

## 2018-11-25 DIAGNOSIS — M1711 Unilateral primary osteoarthritis, right knee: Secondary | ICD-10-CM | POA: Diagnosis not present

## 2018-12-13 DIAGNOSIS — N6001 Solitary cyst of right breast: Secondary | ICD-10-CM | POA: Diagnosis not present

## 2018-12-13 DIAGNOSIS — N6011 Diffuse cystic mastopathy of right breast: Secondary | ICD-10-CM | POA: Diagnosis not present

## 2018-12-13 DIAGNOSIS — R922 Inconclusive mammogram: Secondary | ICD-10-CM | POA: Diagnosis not present

## 2019-01-06 ENCOUNTER — Encounter: Payer: Self-pay | Admitting: Family

## 2019-01-06 ENCOUNTER — Ambulatory Visit: Payer: BLUE CROSS/BLUE SHIELD | Admitting: Family

## 2019-01-06 VITALS — BP 123/74 | HR 77 | Temp 96.9°F | Ht 68.0 in | Wt 190.8 lb

## 2019-01-06 DIAGNOSIS — J0100 Acute maxillary sinusitis, unspecified: Secondary | ICD-10-CM

## 2019-01-06 DIAGNOSIS — R6889 Other general symptoms and signs: Secondary | ICD-10-CM

## 2019-01-06 LAB — VERITOR FLU A/B WAIVED
Influenza A: NEGATIVE
Influenza B: NEGATIVE

## 2019-01-06 MED ORDER — AMOXICILLIN-POT CLAVULANATE 875-125 MG PO TABS: 1.0000 | ORAL_TABLET | Freq: Two times a day (BID) | ORAL | 0 refills | Status: DC

## 2019-01-06 MED ORDER — CLOBETASOL PROPIONATE 0.05 % EX CREA: 1.0000 | TOPICAL_CREAM | Freq: Two times a day (BID) | CUTANEOUS | 5 refills | Status: DC

## 2019-01-06 NOTE — Patient Instructions (Signed)
Sinusitis, Adult  Sinusitis is inflammation of your sinuses. Sinuses are hollow spaces in the bones around your face. Your sinuses are located:   Around your eyes.   In the middle of your forehead.   Behind your nose.   In your cheekbones.  Mucus normally drains out of your sinuses. When your nasal tissues become inflamed or swollen, mucus can become trapped or blocked. This allows bacteria, viruses, and fungi to grow, which leads to infection. Most infections of the sinuses are caused by a virus.  Sinusitis can develop quickly. It can last for up to 4 weeks (acute) or for more than 12 weeks (chronic). Sinusitis often develops after a cold.  What are the causes?  This condition is caused by anything that creates swelling in the sinuses or stops mucus from draining. This includes:   Allergies.   Asthma.   Infection from bacteria or viruses.   Deformities or blockages in your nose or sinuses.   Abnormal growths in the nose (nasal polyps).   Pollutants, such as chemicals or irritants in the air.   Infection from fungi (rare).  What increases the risk?  You are more likely to develop this condition if you:   Have a weak body defense system (immune system).   Do a lot of swimming or diving.   Overuse nasal sprays.   Smoke.  What are the signs or symptoms?  The main symptoms of this condition are pain and a feeling of pressure around the affected sinuses. Other symptoms include:   Stuffy nose or congestion.   Thick drainage from your nose.   Swelling and warmth over the affected sinuses.   Headache.   Upper toothache.   A cough that may get worse at night.   Extra mucus that collects in the throat or the back of the nose (postnasal drip).   Decreased sense of smell and taste.   Fatigue.   A fever.   Sore throat.   Bad breath.  How is this diagnosed?  This condition is diagnosed based on:   Your symptoms.   Your medical history.   A physical exam.   Tests to find out if your condition is  acute or chronic. This may include:  ? Checking your nose for nasal polyps.  ? Viewing your sinuses using a device that has a light (endoscope).  ? Testing for allergies or bacteria.  ? Imaging tests, such as an MRI or CT scan.  In rare cases, a bone biopsy may be done to rule out more serious types of fungal sinus disease.  How is this treated?  Treatment for sinusitis depends on the cause and whether your condition is chronic or acute.   If caused by a virus, your symptoms should go away on their own within 10 days. You may be given medicines to relieve symptoms. They include:  ? Medicines that shrink swollen nasal passages (topical intranasal decongestants).  ? Medicines that treat allergies (antihistamines).  ? A spray that eases inflammation of the nostrils (topical intranasal corticosteroids).  ? Rinses that help get rid of thick mucus in your nose (nasal saline washes).   If caused by bacteria, your health care provider may recommend waiting to see if your symptoms improve. Most bacterial infections will get better without antibiotic medicine. You may be given antibiotics if you have:  ? A severe infection.  ? A weak immune system.   If caused by narrow nasal passages or nasal polyps, you may need   to have surgery.  Follow these instructions at home:  Medicines   Take, use, or apply over-the-counter and prescription medicines only as told by your health care provider. These may include nasal sprays.   If you were prescribed an antibiotic medicine, take it as told by your health care provider. Do not stop taking the antibiotic even if you start to feel better.  Hydrate and humidify     Drink enough fluid to keep your urine pale yellow. Staying hydrated will help to thin your mucus.   Use a cool mist humidifier to keep the humidity level in your home above 50%.   Inhale steam for 10-15 minutes, 3-4 times a day, or as told by your health care provider. You can do this in the bathroom while a hot shower is  running.   Limit your exposure to cool or dry air.  Rest   Rest as much as possible.   Sleep with your head raised (elevated).   Make sure you get enough sleep each night.  General instructions     Apply a warm, moist washcloth to your face 3-4 times a day or as told by your health care provider. This will help with discomfort.   Wash your hands often with soap and water to reduce your exposure to germs. If soap and water are not available, use hand sanitizer.   Do not smoke. Avoid being around people who are smoking (secondhand smoke).   Keep all follow-up visits as told by your health care provider. This is important.  Contact a health care provider if:   You have a fever.   Your symptoms get worse.   Your symptoms do not improve within 10 days.  Get help right away if:   You have a severe headache.   You have persistent vomiting.   You have severe pain or swelling around your face or eyes.   You have vision problems.   You develop confusion.   Your neck is stiff.   You have trouble breathing.  Summary   Sinusitis is soreness and inflammation of your sinuses. Sinuses are hollow spaces in the bones around your face.   This condition is caused by nasal tissues that become inflamed or swollen. The swelling traps or blocks the flow of mucus. This allows bacteria, viruses, and fungi to grow, which leads to infection.   If you were prescribed an antibiotic medicine, take it as told by your health care provider. Do not stop taking the antibiotic even if you start to feel better.   Keep all follow-up visits as told by your health care provider. This is important.  This information is not intended to replace advice given to you by your health care provider. Make sure you discuss any questions you have with your health care provider.  Document Released: 12/01/2005 Document Revised: 05/03/2018 Document Reviewed: 05/03/2018  Elsevier Interactive Patient Education  2019 [employer]

## 2019-01-06 NOTE — Addendum Note (Signed)
Addended by: Evelina Dun A on: 01/06/2019 12:36 PM   Modules accepted: Orders

## 2019-01-06 NOTE — Progress Notes (Signed)
Subjective:    Patient ID: Lori Livingston, female    DOB: 07-22-1956, 63 y.o.   MRN: [id number]  Chief Complaint  Patient presents with  . Cough  . facial and neck soreness    Sinusitis  This is a new problem. The current episode started in the past 7 days. The problem has been gradually worsening since onset. There has been no fever. Her pain is at a severity of 3/10. The pain is mild. Associated symptoms include chills, congestion, coughing, headaches, sinus pressure, sneezing, a sore throat and swollen glands. Pertinent negatives include no ear pain. Past treatments include oral decongestants. The treatment provided mild relief.      Review of Systems  Constitutional: Positive for chills.  HENT: Positive for congestion, sinus pressure, sneezing and sore throat. Negative for ear pain.   Respiratory: Positive for cough.   Neurological: Positive for headaches.  All other systems reviewed and are negative.      Objective:   Physical Exam Vitals signs reviewed.  Constitutional:      General: She is not in acute distress.    Appearance: She is well-developed.  HENT:     Head: Normocephalic and atraumatic.     Nose: Mucosal edema and rhinorrhea present.     Right Sinus: Maxillary sinus tenderness present.     Left Sinus: Maxillary sinus tenderness present.     Mouth/Throat:     Pharynx: Posterior oropharyngeal erythema present.  Eyes:     Pupils: Pupils are equal, round, and reactive to light.  Neck:     Musculoskeletal: Normal range of motion and neck supple.     Thyroid: No thyromegaly.  Cardiovascular:     Rate and Rhythm: Normal rate and regular rhythm.     Heart sounds: Normal heart sounds. No murmur.  Pulmonary:     Effort: Pulmonary effort is normal. No respiratory distress.     Breath sounds: Normal breath sounds. No wheezing.     Comments: Constant coarse nonproductive cough Abdominal:     General: Bowel sounds are normal. There is no distension.   Palpations: Abdomen is soft.     Tenderness: There is no abdominal tenderness.  Musculoskeletal: Normal range of motion.        General: No tenderness.  Skin:    General: Skin is warm and dry.  Neurological:     Mental Status: She is alert and oriented to person, place, and time.     Cranial Nerves: No cranial nerve deficit.     Deep Tendon Reflexes: Reflexes are normal and symmetric.  Psychiatric:        Behavior: Behavior normal.        Thought Content: Thought content normal.        Judgment: Judgment normal.       BP 123/74   Pulse 77   Temp (!) 96.9 F (36.1 C) (Oral)   Ht 5\' 8"  (1.727 m)   Wt 190 lb 12.8 oz (86.5 kg)   BMI 29.01 kg/m      Assessment & Plan:  Lori Livingston comes in today with chief complaint of Cough and facial and neck soreness   Diagnosis and orders addressed:  1. Flu-like symptoms - Veritor Flu A/B Waived  2. Acute maxillary sinusitis, recurrence not specified - Take meds as prescribed - Use a cool mist humidifier  -Use saline nose sprays frequently -Force fluids -For any cough or congestion  Use plain Mucinex- regular strength or max strength is  fine -For fever or aces or pains- take tylenol or ibuprofen. -Throat lozenges if help -New toothbrush in 3 days RTO if symptoms worsen or do not improve  - amoxicillin-clavulanate (AUGMENTIN) 875-125 MG tablet; Take 1 tablet by mouth 2 (two) times daily.  Dispense: 14 tablet; Refill: 0   Evelina Dun, FNP

## 2019-02-17 ENCOUNTER — Ambulatory Visit: Payer: BLUE CROSS/BLUE SHIELD | Admitting: Family

## 2019-02-17 ENCOUNTER — Encounter: Payer: Self-pay | Admitting: Family

## 2019-02-17 VITALS — BP 107/62 | HR 78 | Temp 97.6°F | Ht 68.0 in | Wt 195.0 lb

## 2019-02-17 DIAGNOSIS — J209 Acute bronchitis, unspecified: Secondary | ICD-10-CM

## 2019-02-17 MED ORDER — AZITHROMYCIN 250 MG PO TABS: ORAL_TABLET | ORAL | 0 refills | Status: DC

## 2019-02-17 MED ORDER — BENZONATATE 200 MG PO CAPS: 200.0000 mg | ORAL_CAPSULE | Freq: Three times a day (TID) | ORAL | 1 refills | Status: DC | PRN

## 2019-02-17 NOTE — Progress Notes (Signed)
Subjective:    Patient ID: Lori Livingston, female    DOB: 1956/05/17, 63 y.o.   MRN: [id number]  Chief Complaint  Patient presents with  . Cough    Cough  This is a new problem. The current episode started in the past 7 days. The problem has been gradually worsening. The problem occurs every few minutes. The cough is productive of sputum. Associated symptoms include chills, ear congestion, ear pain, myalgias, nasal congestion and rhinorrhea. Pertinent negatives include no headaches, sore throat or shortness of breath. The symptoms are aggravated by lying down. She has tried rest and OTC cough suppressant for the symptoms. The treatment provided mild relief.      Review of Systems  Constitutional: Positive for chills.  HENT: Positive for ear pain and rhinorrhea. Negative for sore throat.   Respiratory: Positive for cough. Negative for shortness of breath.   Musculoskeletal: Positive for myalgias.  Neurological: Negative for headaches.  All other systems reviewed and are negative.      Objective:   Physical Exam Vitals signs reviewed.  Constitutional:      General: She is not in acute distress.    Appearance: She is well-developed.  HENT:     Head: Normocephalic and atraumatic.     Right Ear: Tympanic membrane normal.     Left Ear: Tympanic membrane normal.     Nose: Mucosal edema present.     Mouth/Throat:     Pharynx: Posterior oropharyngeal erythema present.  Eyes:     Pupils: Pupils are equal, round, and reactive to light.  Neck:     Musculoskeletal: Normal range of motion and neck supple.     Thyroid: No thyromegaly.  Cardiovascular:     Rate and Rhythm: Normal rate and regular rhythm.     Heart sounds: Normal heart sounds. No murmur.  Pulmonary:     Effort: Pulmonary effort is normal. No respiratory distress.     Breath sounds: Normal breath sounds. No wheezing.     Comments: Intermittent nonproductive cough Abdominal:     General: Bowel sounds are normal.  There is no distension.     Palpations: Abdomen is soft.     Tenderness: There is no abdominal tenderness.  Musculoskeletal: Normal range of motion.        General: No tenderness.  Skin:    General: Skin is warm and dry.  Neurological:     Mental Status: She is alert and oriented to person, place, and time.     Cranial Nerves: No cranial nerve deficit.     Deep Tendon Reflexes: Reflexes are normal and symmetric.  Psychiatric:        Behavior: Behavior normal.        Thought Content: Thought content normal.        Judgment: Judgment normal.       BP 107/62   Pulse 78   Temp 97.6 F (36.4 C) (Oral)   Ht 5\' 8"  (1.727 m)   Wt 195 lb (88.5 kg)   BMI 29.65 kg/m      Assessment & Plan:  MERELIN HUMAN comes in today with chief complaint of Cough   Diagnosis and orders addressed:  1. Acute bronchitis, unspecified organism - Take meds as prescribed - Use a cool mist humidifier  -Use saline nose sprays frequently -Force fluids -For any cough or congestion  Use plain Mucinex- regular strength or max strength is fine -For fever or aces or pains- take tylenol or ibuprofen. -Throat  lozenges if help -New toothbrush in 3 days RTO if symptoms worsen or do not improve  - azithromycin (ZITHROMAX) 250 MG tablet; Take 500 mg once, then 250 mg for four days  Dispense: 6 tablet; Refill: 0 - benzonatate (TESSALON) 200 MG capsule; Take 1 capsule (200 mg total) by mouth 3 (three) times daily as needed.  Dispense: 30 capsule; Refill: [place], FNP

## 2019-02-17 NOTE — Patient Instructions (Signed)

## 2019-03-17 DIAGNOSIS — M19041 Primary osteoarthritis, right hand: Secondary | ICD-10-CM | POA: Diagnosis not present

## 2019-03-17 DIAGNOSIS — M19042 Primary osteoarthritis, left hand: Secondary | ICD-10-CM | POA: Diagnosis not present

## 2019-04-18 DIAGNOSIS — B029 Zoster without complications: Secondary | ICD-10-CM | POA: Diagnosis not present

## 2019-05-30 DIAGNOSIS — E782 Mixed hyperlipidemia: Secondary | ICD-10-CM | POA: Diagnosis not present

## 2019-05-30 DIAGNOSIS — Z79899 Other long term (current) drug therapy: Secondary | ICD-10-CM | POA: Diagnosis not present

## 2019-06-08 DIAGNOSIS — R7301 Impaired fasting glucose: Secondary | ICD-10-CM | POA: Diagnosis not present

## 2019-06-08 DIAGNOSIS — E782 Mixed hyperlipidemia: Secondary | ICD-10-CM | POA: Diagnosis not present

## 2019-06-08 DIAGNOSIS — M1711 Unilateral primary osteoarthritis, right knee: Secondary | ICD-10-CM | POA: Diagnosis not present

## 2019-06-14 DIAGNOSIS — N6011 Diffuse cystic mastopathy of right breast: Secondary | ICD-10-CM | POA: Diagnosis not present

## 2019-07-07 DIAGNOSIS — Z01419 Encounter for gynecological examination (general) (routine) without abnormal findings: Secondary | ICD-10-CM | POA: Diagnosis not present

## 2019-07-07 DIAGNOSIS — Z6829 Body mass index (BMI) 29.0-29.9, adult: Secondary | ICD-10-CM | POA: Diagnosis not present

## 2019-07-20 DIAGNOSIS — D1801 Hemangioma of skin and subcutaneous tissue: Secondary | ICD-10-CM | POA: Diagnosis not present

## 2019-07-20 DIAGNOSIS — L821 Other seborrheic keratosis: Secondary | ICD-10-CM | POA: Diagnosis not present

## 2019-07-20 DIAGNOSIS — D2270 Melanocytic nevi of unspecified lower limb, including hip: Secondary | ICD-10-CM | POA: Diagnosis not present

## 2019-07-20 DIAGNOSIS — L249 Irritant contact dermatitis, unspecified cause: Secondary | ICD-10-CM | POA: Diagnosis not present

## 2019-07-26 ENCOUNTER — Ambulatory Visit: Payer: BLUE CROSS/BLUE SHIELD | Admitting: Family

## 2019-07-26 ENCOUNTER — Other Ambulatory Visit: Payer: Self-pay

## 2019-07-26 ENCOUNTER — Encounter: Payer: Self-pay | Admitting: Family

## 2019-07-26 VITALS — BP 130/74 | HR 63 | Temp 97.1°F | Ht 68.0 in | Wt 188.4 lb

## 2019-07-26 DIAGNOSIS — M1711 Unilateral primary osteoarthritis, right knee: Secondary | ICD-10-CM | POA: Diagnosis not present

## 2019-07-26 DIAGNOSIS — E559 Vitamin D deficiency, unspecified: Secondary | ICD-10-CM

## 2019-07-26 DIAGNOSIS — E785 Hyperlipidemia, unspecified: Secondary | ICD-10-CM

## 2019-07-26 DIAGNOSIS — Z79890 Hormone replacement therapy (postmenopausal): Secondary | ICD-10-CM

## 2019-07-26 DIAGNOSIS — E663 Overweight: Secondary | ICD-10-CM

## 2019-07-26 DIAGNOSIS — N951 Menopausal and female climacteric states: Secondary | ICD-10-CM

## 2019-07-26 MED ORDER — MELOXICAM 15 MG PO TABS: 15.0000 mg | ORAL_TABLET | Freq: Every day | ORAL | 3 refills | Status: DC

## 2019-07-26 MED ORDER — PRAVASTATIN SODIUM 40 MG PO TABS: 40.0000 mg | ORAL_TABLET | Freq: Every day | ORAL | 2 refills | Status: DC

## 2019-07-26 NOTE — Patient Instructions (Signed)
Health Maintenance, Female Adopting a healthy lifestyle and getting preventive care are important in promoting health and wellness. Ask your health care provider about:  The right schedule for you to have regular tests and exams.  Things you can do on your own to prevent diseases and keep yourself healthy. What should I know about diet, weight, and exercise? Eat a healthy diet   Eat a diet that includes plenty of vegetables, fruits, low-fat dairy products, and lean protein.  Do not eat a lot of foods that are high in solid fats, added sugars, or sodium. Maintain a healthy weight Body mass index (BMI) is used to identify weight problems. It estimates body fat based on height and weight. Your health care provider can help determine your BMI and help you achieve or maintain a healthy weight. Get regular exercise Get regular exercise. This is one of the most important things you can do for your health. Most adults should:  Exercise for at least 150 minutes each week. The exercise should increase your heart rate and make you sweat (moderate-intensity exercise).  Do strengthening exercises at least twice a week. This is in addition to the moderate-intensity exercise.  Spend less time sitting. Even light physical activity can be beneficial. Watch cholesterol and blood lipids Have your blood tested for lipids and cholesterol at 63 years of age, then have this test every 5 years. Have your cholesterol levels checked more often if:  Your lipid or cholesterol levels are high.  You are older than 63 years of age.  You are at high risk for heart disease. What should I know about cancer screening? Depending on your health history and family history, you may need to have cancer screening at various ages. This may include screening for:  Breast cancer.  Cervical cancer.  Colorectal cancer.  Skin cancer.  Lung cancer. What should I know about heart disease, diabetes, and high blood  pressure? Blood pressure and heart disease  High blood pressure causes heart disease and increases the risk of stroke. This is more likely to develop in people who have high blood pressure readings, are of African descent, or are overweight.  Have your blood pressure checked: ? Every 3-5 years if you are 18-39 years of age. ? Every year if you are 40 years old or older. Diabetes Have regular diabetes screenings. This checks your fasting blood sugar level. Have the screening done:  Once every three years after age 40 if you are at a normal weight and have a low risk for diabetes.  More often and at a younger age if you are overweight or have a high risk for diabetes. What should I know about preventing infection? Hepatitis B If you have a higher risk for hepatitis B, you should be screened for this virus. Talk with your health care provider to find out if you are at risk for hepatitis B infection. Hepatitis C Testing is recommended for:  Everyone born from 1945 through 1965.  Anyone with known risk factors for hepatitis C. Sexually transmitted infections (STIs)  Get screened for STIs, including gonorrhea and chlamydia, if: ? You are sexually active and are younger than 63 years of age. ? You are older than 63 years of age and your health care provider tells you that you are at risk for this type of infection. ? Your sexual activity has changed since you were last screened, and you are at increased risk for chlamydia or gonorrhea. Ask your health care provider if   you are at risk.  Ask your health care provider about whether you are at high risk for HIV. Your health care provider may recommend a prescription medicine to help prevent HIV infection. If you choose to take medicine to prevent HIV, you should first get tested for HIV. You should then be tested every 3 months for as long as you are taking the medicine. Pregnancy  If you are about to stop having your period (premenopausal) and  you may become pregnant, seek counseling before you get pregnant.  Take 400 to 800 micrograms (mcg) of folic acid every day if you become pregnant.  Ask for birth control (contraception) if you want to prevent pregnancy. Osteoporosis and menopause Osteoporosis is a disease in which the bones lose minerals and strength with aging. This can result in bone fractures. If you are 65 years old or older, or if you are at risk for osteoporosis and fractures, ask your health care provider if you should:  Be screened for bone loss.  Take a calcium or vitamin D supplement to lower your risk of fractures.  Be given hormone replacement therapy (HRT) to treat symptoms of menopause. Follow these instructions at home: Lifestyle  Do not use any products that contain nicotine or tobacco, such as cigarettes, e-cigarettes, and chewing tobacco. If you need help quitting, ask your health care provider.  Do not use street drugs.  Do not share needles.  Ask your health care provider for help if you need support or information about quitting drugs. Alcohol use  Do not drink alcohol if: ? Your health care provider tells you not to drink. ? You are pregnant, may be pregnant, or are planning to become pregnant.  If you drink alcohol: ? Limit how much you use to 0-1 drink a day. ? Limit intake if you are breastfeeding.  Be aware of how much alcohol is in your drink. In the [place] one drink equals one 12 oz bottle of beer (355 mL), one 5 oz glass of wine (148 mL), or one 1 oz glass of hard liquor (44 mL). General instructions  Schedule regular health, dental, and eye exams.  Stay current with your vaccines.  Tell your health care provider if: ? You often feel depressed. ? You have ever been abused or do not feel safe at home. Summary  Adopting a healthy lifestyle and getting preventive care are important in promoting health and wellness.  Follow your health care provider's instructions about healthy  diet, exercising, and getting tested or screened for diseases.  Follow your health care provider's instructions on monitoring your cholesterol and blood pressure. This information is not intended to replace advice given to you by your health care provider. Make sure you discuss any questions you have with your health care provider. Document Released: 06/16/2011 Document Revised: 11/24/2018 Document Reviewed: 11/24/2018 Elsevier Patient Education  2020 [employer] Inc.  

## 2019-07-26 NOTE — Progress Notes (Signed)
Subjective:    Patient ID: Lori Livingston, female    DOB: [birth year], 63 y.o.   MRN: [id number]  Chief Complaint  Patient presents with  . Medical Management of Chronic Issues   PT presents to the office today for chronic follow up. PT is followed by gynecologists every year. Pt is on Estrace after her hysterectomy in 2010 for hot flashes.She had lab work drawn on 05/30/19 we will scan into her chart.  Hyperlipidemia This is a chronic problem. The current episode started more than 1 year ago. The problem is controlled. Recent lipid tests were reviewed and are normal. Exacerbating diseases include obesity. Current antihyperlipidemic treatment includes statins. The current treatment provides moderate improvement of lipids. Risk factors for coronary artery disease include dyslipidemia, post-menopausal and a sedentary lifestyle.  Arthritis Presents for follow-up visit. She complains of pain and stiffness. The symptoms have been stable. Affected locations include the right knee and left knee. Her pain is at a severity of 8/10 (after working).      Review of Systems  Musculoskeletal: Positive for arthritis and stiffness.  All other systems reviewed and are negative.      Objective:   Physical Exam Vitals signs reviewed.  Constitutional:      General: She is not in acute distress.    Appearance: She is well-developed.  HENT:     Head: Normocephalic and atraumatic.     Right Ear: Tympanic membrane normal.     Left Ear: Tympanic membrane normal.  Eyes:     Pupils: Pupils are equal, round, and reactive to light.  Neck:     Musculoskeletal: Normal range of motion and neck supple.     Thyroid: No thyromegaly.  Cardiovascular:     Rate and Rhythm: Normal rate and regular rhythm.     Heart sounds: Normal heart sounds. No murmur.  Pulmonary:     Effort: Pulmonary effort is normal. No respiratory distress.     Breath sounds: Normal breath sounds. No wheezing.  Abdominal:     General:  Bowel sounds are normal. There is no distension.     Palpations: Abdomen is soft.     Tenderness: There is no abdominal tenderness.  Musculoskeletal:        General: Tenderness (right knee with flexion and extension) present.  Skin:    General: Skin is warm and dry.  Neurological:     Mental Status: She is alert and oriented to person, place, and time.     Cranial Nerves: No cranial nerve deficit.     Deep Tendon Reflexes: Reflexes are normal and symmetric.  Psychiatric:        Behavior: Behavior normal.        Thought Content: Thought content normal.        Judgment: Judgment normal.      BP 130/74   Pulse 63   Temp (!) 97.1 F (36.2 C) (Oral)   Ht 5\' 8"  (1.727 m)   Wt 188 lb 6.4 oz (85.5 kg)   BMI 28.65 kg/m      Assessment & Plan:  Lori Livingston comes in today with chief complaint of Medical Management of Chronic Issues   Diagnosis and orders addressed:  1. Hyperlipidemia, unspecified hyperlipidemia type - pravastatin (PRAVACHOL) 40 MG tablet; Take 1 tablet (40 mg total) by mouth at bedtime.  Dispense: 90 tablet; Refill: 2  2. Vitamin D deficiency  3. Arthritis of knee, right - meloxicam (MOBIC) 15 MG tablet; Take 1 tablet (  15 mg total) by mouth daily.  Dispense: 90 tablet; Refill: 3  4. Overweight (BMI 25.0-29.9)  5. Menopausal syndrome on hormone replacement therapy  6. Hyperlipidemia    Labs reviewed- Will scanned into the chart Health Maintenance reviewed Diet and exercise encouraged  Follow up plan: 6 months    Evelina Dun, FNP

## 2019-08-08 IMAGING — DX DG CHEST 2V
2 series · 2 of 2 positions shown · non-contrast
Comparison: None.

CLINICAL DATA: Preoperative evaluation for knee replacement surgery

EXAM:
CHEST - 2 VIEW

[chest pa]
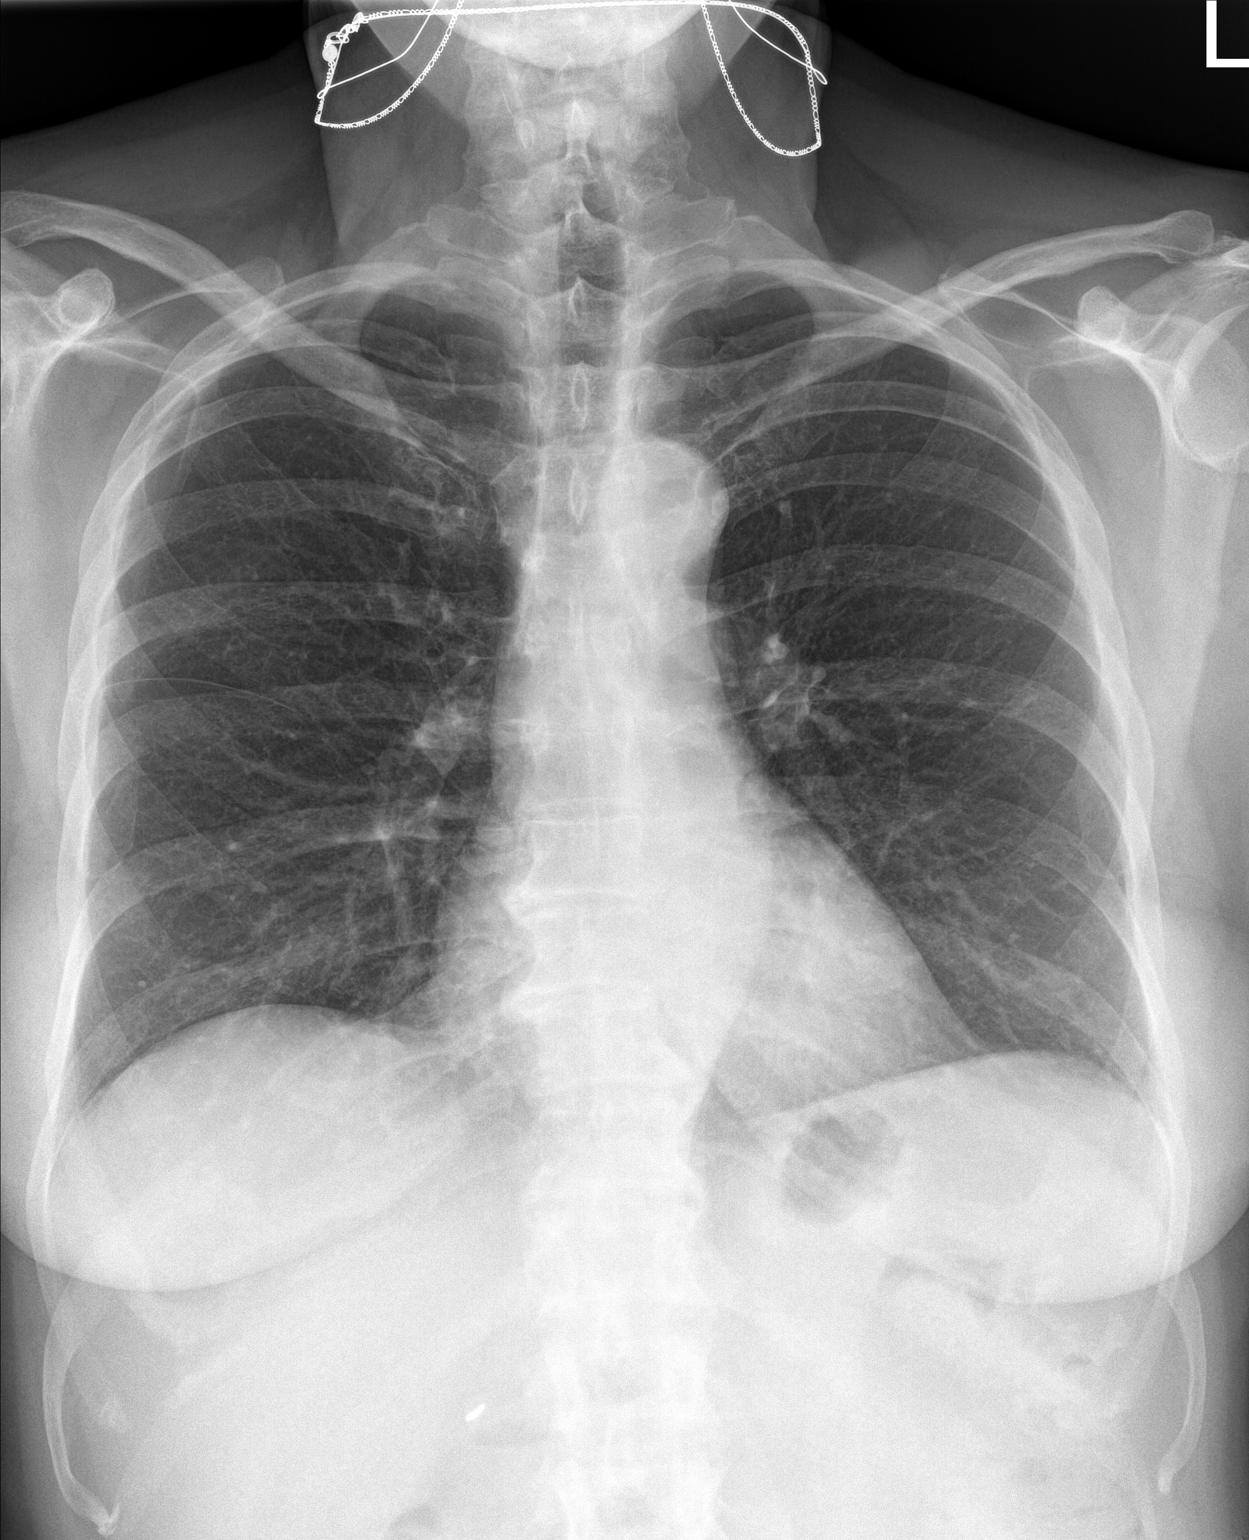

[chest lat]
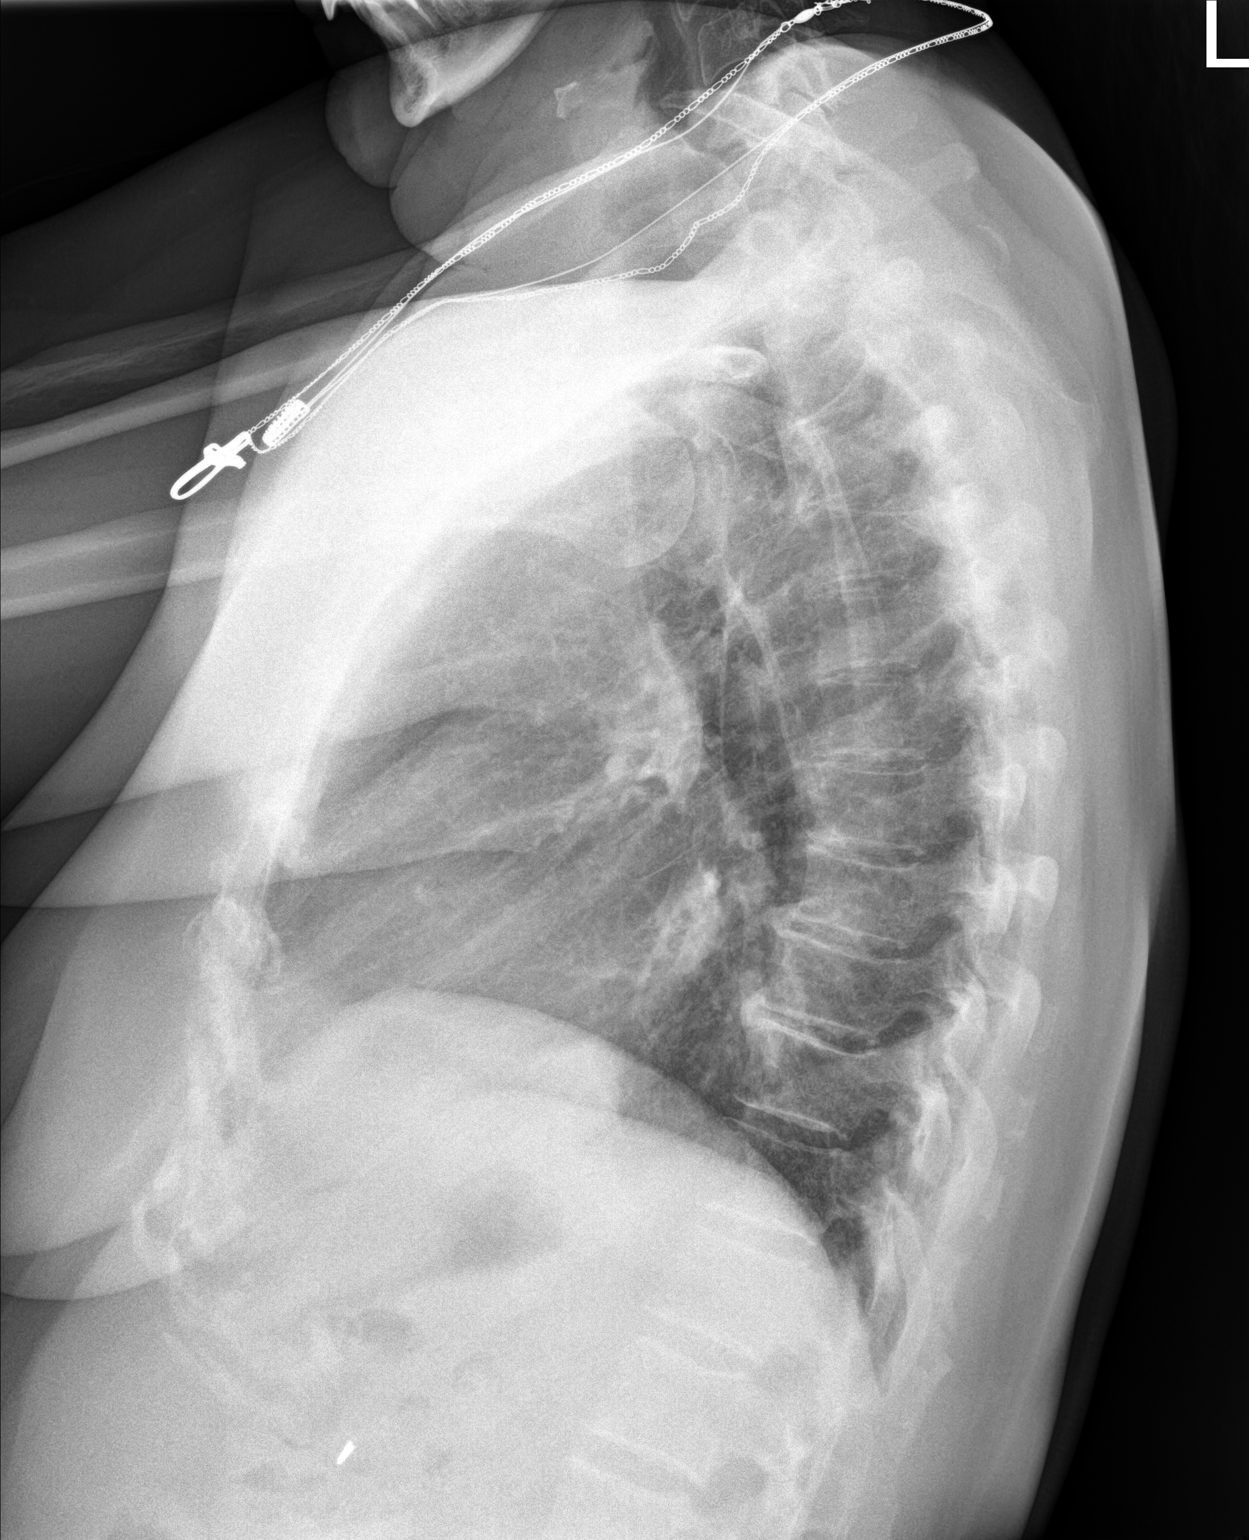

[2 of 2 positions shown; findings below may reference images not displayed]

FINDINGS: No edema or consolidation. Heart size and pulmonary vascularity are
normal. No adenopathy. There is mild degenerative change in the
lower thoracic spine.
IMPRESSION: No edema or consolidation.

## 2019-08-18 DIAGNOSIS — E559 Vitamin D deficiency, unspecified: Secondary | ICD-10-CM | POA: Diagnosis not present

## 2019-08-18 DIAGNOSIS — Z79899 Other long term (current) drug therapy: Secondary | ICD-10-CM | POA: Diagnosis not present

## 2019-08-18 DIAGNOSIS — G894 Chronic pain syndrome: Secondary | ICD-10-CM | POA: Diagnosis not present

## 2019-08-18 DIAGNOSIS — M1990 Unspecified osteoarthritis, unspecified site: Secondary | ICD-10-CM | POA: Diagnosis not present

## 2019-08-18 DIAGNOSIS — M129 Arthropathy, unspecified: Secondary | ICD-10-CM | POA: Diagnosis not present

## 2019-09-28 DIAGNOSIS — L249 Irritant contact dermatitis, unspecified cause: Secondary | ICD-10-CM | POA: Diagnosis not present

## 2019-09-28 DIAGNOSIS — L820 Inflamed seborrheic keratosis: Secondary | ICD-10-CM | POA: Diagnosis not present

## 2019-10-12 DIAGNOSIS — Z719 Counseling, unspecified: Secondary | ICD-10-CM | POA: Diagnosis not present

## 2019-10-12 DIAGNOSIS — E782 Mixed hyperlipidemia: Secondary | ICD-10-CM | POA: Diagnosis not present

## 2019-10-12 DIAGNOSIS — M1711 Unilateral primary osteoarthritis, right knee: Secondary | ICD-10-CM | POA: Diagnosis not present

## 2019-10-12 DIAGNOSIS — Z008 Encounter for other general examination: Secondary | ICD-10-CM | POA: Diagnosis not present

## 2019-10-12 DIAGNOSIS — R7301 Impaired fasting glucose: Secondary | ICD-10-CM | POA: Diagnosis not present

## 2019-10-12 DIAGNOSIS — T8484XA Pain due to internal orthopedic prosthetic devices, implants and grafts, initial encounter: Secondary | ICD-10-CM | POA: Diagnosis not present

## 2019-10-31 DIAGNOSIS — Z139 Encounter for screening, unspecified: Secondary | ICD-10-CM | POA: Diagnosis not present

## 2019-10-31 DIAGNOSIS — Z0130 Encounter for examination of blood pressure without abnormal findings: Secondary | ICD-10-CM | POA: Diagnosis not present

## 2019-10-31 DIAGNOSIS — Z79899 Other long term (current) drug therapy: Secondary | ICD-10-CM | POA: Diagnosis not present

## 2019-10-31 DIAGNOSIS — R7301 Impaired fasting glucose: Secondary | ICD-10-CM | POA: Diagnosis not present

## 2019-10-31 DIAGNOSIS — E782 Mixed hyperlipidemia: Secondary | ICD-10-CM | POA: Diagnosis not present

## 2019-10-31 DIAGNOSIS — E559 Vitamin D deficiency, unspecified: Secondary | ICD-10-CM | POA: Diagnosis not present

## 2019-11-23 DIAGNOSIS — T8484XD Pain due to internal orthopedic prosthetic devices, implants and grafts, subsequent encounter: Secondary | ICD-10-CM | POA: Diagnosis not present

## 2019-11-23 DIAGNOSIS — M25561 Pain in right knee: Secondary | ICD-10-CM | POA: Diagnosis not present

## 2019-11-23 DIAGNOSIS — M1711 Unilateral primary osteoarthritis, right knee: Secondary | ICD-10-CM | POA: Diagnosis not present

## 2019-11-23 DIAGNOSIS — E782 Mixed hyperlipidemia: Secondary | ICD-10-CM | POA: Diagnosis not present

## 2019-11-23 DIAGNOSIS — R7301 Impaired fasting glucose: Secondary | ICD-10-CM | POA: Diagnosis not present

## 2019-12-07 DIAGNOSIS — T8484XD Pain due to internal orthopedic prosthetic devices, implants and grafts, subsequent encounter: Secondary | ICD-10-CM | POA: Diagnosis not present

## 2019-12-13 DIAGNOSIS — N6001 Solitary cyst of right breast: Secondary | ICD-10-CM | POA: Diagnosis not present

## 2019-12-13 DIAGNOSIS — N6011 Diffuse cystic mastopathy of right breast: Secondary | ICD-10-CM | POA: Diagnosis not present

## 2019-12-13 DIAGNOSIS — R922 Inconclusive mammogram: Secondary | ICD-10-CM | POA: Diagnosis not present

## 2019-12-14 ENCOUNTER — Telehealth: Payer: Self-pay

## 2019-12-14 NOTE — Telephone Encounter (Signed)
Called and notified patient of results from diagnostic mammogram of the right breast - please see report in chart.  Patient expressed understanding and will contact us with any further questions or concerns.

## 2019-12-15 DIAGNOSIS — M6281 Muscle weakness (generalized): Secondary | ICD-10-CM | POA: Diagnosis not present

## 2019-12-15 DIAGNOSIS — M25461 Effusion, right knee: Secondary | ICD-10-CM | POA: Diagnosis not present

## 2019-12-15 DIAGNOSIS — M25661 Stiffness of right knee, not elsewhere classified: Secondary | ICD-10-CM | POA: Diagnosis not present

## 2019-12-15 DIAGNOSIS — M25561 Pain in right knee: Secondary | ICD-10-CM | POA: Diagnosis not present

## 2019-12-21 DIAGNOSIS — M25561 Pain in right knee: Secondary | ICD-10-CM | POA: Diagnosis not present

## 2019-12-21 DIAGNOSIS — M6281 Muscle weakness (generalized): Secondary | ICD-10-CM | POA: Diagnosis not present

## 2019-12-21 DIAGNOSIS — M25461 Effusion, right knee: Secondary | ICD-10-CM | POA: Diagnosis not present

## 2019-12-21 DIAGNOSIS — M25661 Stiffness of right knee, not elsewhere classified: Secondary | ICD-10-CM | POA: Diagnosis not present

## 2019-12-22 DIAGNOSIS — M25661 Stiffness of right knee, not elsewhere classified: Secondary | ICD-10-CM | POA: Diagnosis not present

## 2019-12-22 DIAGNOSIS — M25461 Effusion, right knee: Secondary | ICD-10-CM | POA: Diagnosis not present

## 2019-12-22 DIAGNOSIS — M25561 Pain in right knee: Secondary | ICD-10-CM | POA: Diagnosis not present

## 2019-12-22 DIAGNOSIS — M6281 Muscle weakness (generalized): Secondary | ICD-10-CM | POA: Diagnosis not present

## 2019-12-27 DIAGNOSIS — M25461 Effusion, right knee: Secondary | ICD-10-CM | POA: Diagnosis not present

## 2019-12-27 DIAGNOSIS — M6281 Muscle weakness (generalized): Secondary | ICD-10-CM | POA: Diagnosis not present

## 2019-12-27 DIAGNOSIS — M25561 Pain in right knee: Secondary | ICD-10-CM | POA: Diagnosis not present

## 2019-12-27 DIAGNOSIS — M25661 Stiffness of right knee, not elsewhere classified: Secondary | ICD-10-CM | POA: Diagnosis not present

## 2019-12-30 DIAGNOSIS — M25461 Effusion, right knee: Secondary | ICD-10-CM | POA: Diagnosis not present

## 2019-12-30 DIAGNOSIS — M25661 Stiffness of right knee, not elsewhere classified: Secondary | ICD-10-CM | POA: Diagnosis not present

## 2019-12-30 DIAGNOSIS — M6281 Muscle weakness (generalized): Secondary | ICD-10-CM | POA: Diagnosis not present

## 2019-12-30 DIAGNOSIS — M25561 Pain in right knee: Secondary | ICD-10-CM | POA: Diagnosis not present

## 2020-01-05 DIAGNOSIS — M6281 Muscle weakness (generalized): Secondary | ICD-10-CM | POA: Diagnosis not present

## 2020-01-05 DIAGNOSIS — M25461 Effusion, right knee: Secondary | ICD-10-CM | POA: Diagnosis not present

## 2020-01-05 DIAGNOSIS — M25561 Pain in right knee: Secondary | ICD-10-CM | POA: Diagnosis not present

## 2020-01-05 DIAGNOSIS — M25661 Stiffness of right knee, not elsewhere classified: Secondary | ICD-10-CM | POA: Diagnosis not present

## 2020-01-10 DIAGNOSIS — M25661 Stiffness of right knee, not elsewhere classified: Secondary | ICD-10-CM | POA: Diagnosis not present

## 2020-01-10 DIAGNOSIS — M25461 Effusion, right knee: Secondary | ICD-10-CM | POA: Diagnosis not present

## 2020-01-10 DIAGNOSIS — M25561 Pain in right knee: Secondary | ICD-10-CM | POA: Diagnosis not present

## 2020-01-10 DIAGNOSIS — M6281 Muscle weakness (generalized): Secondary | ICD-10-CM | POA: Diagnosis not present

## 2020-01-18 DIAGNOSIS — M25572 Pain in left ankle and joints of left foot: Secondary | ICD-10-CM | POA: Diagnosis not present

## 2020-02-02 ENCOUNTER — Ambulatory Visit (INDEPENDENT_AMBULATORY_CARE_PROVIDER_SITE_OTHER): Payer: BC Managed Care – PPO | Admitting: Family

## 2020-02-02 ENCOUNTER — Other Ambulatory Visit: Payer: Self-pay

## 2020-02-02 ENCOUNTER — Encounter: Payer: Self-pay | Admitting: Family

## 2020-02-02 DIAGNOSIS — M1711 Unilateral primary osteoarthritis, right knee: Secondary | ICD-10-CM | POA: Diagnosis not present

## 2020-02-02 DIAGNOSIS — E785 Hyperlipidemia, unspecified: Secondary | ICD-10-CM | POA: Diagnosis not present

## 2020-02-02 DIAGNOSIS — E559 Vitamin D deficiency, unspecified: Secondary | ICD-10-CM

## 2020-02-02 DIAGNOSIS — E663 Overweight: Secondary | ICD-10-CM | POA: Diagnosis not present

## 2020-02-02 MED ORDER — PRAVASTATIN SODIUM 40 MG PO TABS: 40.0000 mg | ORAL_TABLET | Freq: Every day | ORAL | 2 refills | Status: DC

## 2020-02-02 NOTE — Progress Notes (Signed)
   Virtual Visit via telephone Note Due to COVID-19 pandemic this visit was conducted virtually. This visit type was conducted due to national recommendations for restrictions regarding the COVID-19 Pandemic (e.g. social distancing, sheltering in place) in an effort to limit this patient's exposure and mitigate transmission in our community. All issues noted in this document were discussed and addressed.  A physical exam was not performed with this format.  I connected with Lori Livingston on 02/02/20 at 8:27 AM by telephone and verified that I am speaking with the correct person using two identifiers. Lori Livingston is currently located at home and husband is currently with her during visit. The provider, Evelina Dun, FNP is located in their office at time of visit.  I discussed the limitations, risks, security and privacy concerns of performing an evaluation and management service by telephone and the availability of in person appointments. I also discussed with the patient that there may be a patient responsible charge related to this service. The patient expressed understanding and agreed to proceed.   History and Present Illness:  PT presents to the office today for chronic follow up.PT is followed by gynecologists every year. Pt is on Estrace after her hysterectomy in 2010 for hot flashes.Pt had labs drawn in 10/2019, scanned into Epic. Hyperlipidemia This is a chronic problem. The current episode started more than 1 year ago. The problem is controlled. Recent lipid tests were reviewed and are normal. Exacerbating diseases include obesity. Current antihyperlipidemic treatment includes statins. The current treatment provides moderate improvement of lipids. Risk factors for coronary artery disease include dyslipidemia, hypertension, post-menopausal and a sedentary lifestyle.  Arthritis Presents for follow-up visit. She complains of pain. The symptoms have been stable. Affected locations  include the right knee. Her pain is at a severity of 6/10.      Review of Systems  Musculoskeletal: Positive for arthritis and joint pain.  All other systems reviewed and are negative.    Observations/Objective: No SOB or distress noted  Assessment and Plan: 1. Arthritis of knee, right  2. Hyperlipidemia, unspecified hyperlipidemia type - pravastatin (PRAVACHOL) 40 MG tablet; Take 1 tablet (40 mg total) by mouth at bedtime.  Dispense: 90 tablet; Refill: 2  3. Vitamin D deficiency  4. Overweight (BMI 25.0-29.9)  Labs reviewed  Continue medications  Healthy diet and exercise encouraged  RTO in 6 months       I discussed the assessment and treatment plan with the patient. The patient was provided an opportunity to ask questions and all were answered. The patient agreed with the plan and demonstrated an understanding of the instructions.   The patient was advised to call back or seek an in-person evaluation if the symptoms worsen or if the condition fails to improve as anticipated.  The above assessment and management plan was discussed with the patient. The patient verbalized understanding of and has agreed to the management plan. Patient is aware to call the clinic if symptoms persist or worsen. Patient is aware when to return to the clinic for a follow-up visit. Patient educated on when it is appropriate to go to the emergency department.   Time call ended:  8:44 AM  I provided 17 minutes of non-face-to-face time during this encounter.    Evelina Dun, FNP

## 2020-02-10 DIAGNOSIS — M25572 Pain in left ankle and joints of left foot: Secondary | ICD-10-CM | POA: Diagnosis not present

## 2020-02-16 DIAGNOSIS — M25572 Pain in left ankle and joints of left foot: Secondary | ICD-10-CM | POA: Diagnosis not present

## 2020-02-20 DIAGNOSIS — M25572 Pain in left ankle and joints of left foot: Secondary | ICD-10-CM | POA: Diagnosis not present

## 2020-04-12 DIAGNOSIS — S01502A Unspecified open wound of oral cavity, initial encounter: Secondary | ICD-10-CM | POA: Diagnosis not present

## 2020-05-20 DIAGNOSIS — M1712 Unilateral primary osteoarthritis, left knee: Secondary | ICD-10-CM | POA: Diagnosis not present

## 2020-06-15 DIAGNOSIS — M1712 Unilateral primary osteoarthritis, left knee: Secondary | ICD-10-CM | POA: Diagnosis not present

## 2020-06-26 DIAGNOSIS — N6310 Unspecified lump in the right breast, unspecified quadrant: Secondary | ICD-10-CM | POA: Diagnosis not present

## 2020-06-26 DIAGNOSIS — R928 Other abnormal and inconclusive findings on diagnostic imaging of breast: Secondary | ICD-10-CM | POA: Diagnosis not present

## 2020-06-26 DIAGNOSIS — N6320 Unspecified lump in the left breast, unspecified quadrant: Secondary | ICD-10-CM | POA: Diagnosis not present

## 2020-06-26 DIAGNOSIS — N6011 Diffuse cystic mastopathy of right breast: Secondary | ICD-10-CM | POA: Diagnosis not present

## 2020-07-19 DIAGNOSIS — D1801 Hemangioma of skin and subcutaneous tissue: Secondary | ICD-10-CM | POA: Diagnosis not present

## 2020-07-19 DIAGNOSIS — D2260 Melanocytic nevi of unspecified upper limb, including shoulder: Secondary | ICD-10-CM | POA: Diagnosis not present

## 2020-07-19 DIAGNOSIS — L821 Other seborrheic keratosis: Secondary | ICD-10-CM | POA: Diagnosis not present

## 2020-07-19 DIAGNOSIS — L820 Inflamed seborrheic keratosis: Secondary | ICD-10-CM | POA: Diagnosis not present

## 2020-08-02 ENCOUNTER — Other Ambulatory Visit: Payer: Self-pay

## 2020-08-02 ENCOUNTER — Ambulatory Visit: Payer: BC Managed Care – PPO | Admitting: Family

## 2020-08-02 ENCOUNTER — Encounter: Payer: Self-pay | Admitting: Family

## 2020-08-02 VITALS — BP 108/65 | HR 70 | Temp 97.8°F | Ht 68.0 in | Wt 194.2 lb

## 2020-08-02 DIAGNOSIS — M1711 Unilateral primary osteoarthritis, right knee: Secondary | ICD-10-CM | POA: Diagnosis not present

## 2020-08-02 DIAGNOSIS — E785 Hyperlipidemia, unspecified: Secondary | ICD-10-CM | POA: Diagnosis not present

## 2020-08-02 DIAGNOSIS — N951 Menopausal and female climacteric states: Secondary | ICD-10-CM

## 2020-08-02 DIAGNOSIS — E559 Vitamin D deficiency, unspecified: Secondary | ICD-10-CM

## 2020-08-02 DIAGNOSIS — E663 Overweight: Secondary | ICD-10-CM

## 2020-08-02 DIAGNOSIS — Z79890 Hormone replacement therapy (postmenopausal): Secondary | ICD-10-CM

## 2020-08-02 MED ORDER — MELOXICAM 15 MG PO TABS: 15.0000 mg | ORAL_TABLET | Freq: Every day | ORAL | 3 refills | Status: DC

## 2020-08-02 NOTE — Patient Instructions (Signed)
Health Maintenance, Female Adopting a healthy lifestyle and getting preventive care are important in promoting health and wellness. Ask your health care provider about:  The right schedule for you to have regular tests and exams.  Things you can do on your own to prevent diseases and keep yourself healthy. What should I know about diet, weight, and exercise? Eat a healthy diet   Eat a diet that includes plenty of vegetables, fruits, low-fat dairy products, and lean protein.  Do not eat a lot of foods that are high in solid fats, added sugars, or sodium. Maintain a healthy weight Body mass index (BMI) is used to identify weight problems. It estimates body fat based on height and weight. Your health care provider can help determine your BMI and help you achieve or maintain a healthy weight. Get regular exercise Get regular exercise. This is one of the most important things you can do for your health. Most adults should:  Exercise for at least 150 minutes each week. The exercise should increase your heart rate and make you sweat (moderate-intensity exercise).  Do strengthening exercises at least twice a week. This is in addition to the moderate-intensity exercise.  Spend less time sitting. Even light physical activity can be beneficial. Watch cholesterol and blood lipids Have your blood tested for lipids and cholesterol at 64 years of age, then have this test every 5 years. Have your cholesterol levels checked more often if:  Your lipid or cholesterol levels are high.  You are older than 64 years of age.  You are at high risk for heart disease. What should I know about cancer screening? Depending on your health history and family history, you may need to have cancer screening at various ages. This may include screening for:  Breast cancer.  Cervical cancer.  Colorectal cancer.  Skin cancer.  Lung cancer. What should I know about heart disease, diabetes, and high blood  pressure? Blood pressure and heart disease  High blood pressure causes heart disease and increases the risk of stroke. This is more likely to develop in people who have high blood pressure readings, are of African descent, or are overweight.  Have your blood pressure checked: ? Every 3-5 years if you are 18-39 years of age. ? Every year if you are 40 years old or older. Diabetes Have regular diabetes screenings. This checks your fasting blood sugar level. Have the screening done:  Once every three years after age 40 if you are at a normal weight and have a low risk for diabetes.  More often and at a younger age if you are overweight or have a high risk for diabetes. What should I know about preventing infection? Hepatitis B If you have a higher risk for hepatitis B, you should be screened for this virus. Talk with your health care provider to find out if you are at risk for hepatitis B infection. Hepatitis C Testing is recommended for:  Everyone born from 1945 through 1965.  Anyone with known risk factors for hepatitis C. Sexually transmitted infections (STIs)  Get screened for STIs, including gonorrhea and chlamydia, if: ? You are sexually active and are younger than 64 years of age. ? You are older than 64 years of age and your health care provider tells you that you are at risk for this type of infection. ? Your sexual activity has changed since you were last screened, and you are at increased risk for chlamydia or gonorrhea. Ask your health care provider if   you are at risk.  Ask your health care provider about whether you are at high risk for HIV. Your health care provider may recommend a prescription medicine to help prevent HIV infection. If you choose to take medicine to prevent HIV, you should first get tested for HIV. You should then be tested every 3 months for as long as you are taking the medicine. Pregnancy  If you are about to stop having your period (premenopausal) and  you may become pregnant, seek counseling before you get pregnant.  Take 400 to 800 micrograms (mcg) of folic acid every day if you become pregnant.  Ask for birth control (contraception) if you want to prevent pregnancy. Osteoporosis and menopause Osteoporosis is a disease in which the bones lose minerals and strength with aging. This can result in bone fractures. If you are 65 years old or older, or if you are at risk for osteoporosis and fractures, ask your health care provider if you should:  Be screened for bone loss.  Take a calcium or vitamin D supplement to lower your risk of fractures.  Be given hormone replacement therapy (HRT) to treat symptoms of menopause. Follow these instructions at home: Lifestyle  Do not use any products that contain nicotine or tobacco, such as cigarettes, e-cigarettes, and chewing tobacco. If you need help quitting, ask your health care provider.  Do not use street drugs.  Do not share needles.  Ask your health care provider for help if you need support or information about quitting drugs. Alcohol use  Do not drink alcohol if: ? Your health care provider tells you not to drink. ? You are pregnant, may be pregnant, or are planning to become pregnant.  If you drink alcohol: ? Limit how much you use to 0-1 drink a day. ? Limit intake if you are breastfeeding.  Be aware of how much alcohol is in your drink. In the [place] one drink equals one 12 oz bottle of beer (355 mL), one 5 oz glass of wine (148 mL), or one 1 oz glass of hard liquor (44 mL). General instructions  Schedule regular health, dental, and eye exams.  Stay current with your vaccines.  Tell your health care provider if: ? You often feel depressed. ? You have ever been abused or do not feel safe at home. Summary  Adopting a healthy lifestyle and getting preventive care are important in promoting health and wellness.  Follow your health care provider's instructions about healthy  diet, exercising, and getting tested or screened for diseases.  Follow your health care provider's instructions on monitoring your cholesterol and blood pressure. This information is not intended to replace advice given to you by your health care provider. Make sure you discuss any questions you have with your health care provider. Document Revised: 11/24/2018 Document Reviewed: 11/24/2018 Elsevier Patient Education  2020 Elsevier Inc.  

## 2020-08-02 NOTE — Progress Notes (Signed)
Subjective:    Patient ID: Lori Livingston, female    DOB: 03-18-1956, 64 y.o.   MRN: [id number]  Chief Complaint  Patient presents with  . Medical Management of Chronic Issues    6 mth, not fasting   . Hyperlipidemia   PT presents to the office today for chronic follow up.PT is followed by gynecologists every year. Pt is on Estrace after her hysterectomy in 2010 for hot flashes.Pt is followed by Ortho for bilateral knee pain. She had a right knee replaced in  2019. Hyperlipidemia This is a chronic problem. The current episode started more than 1 year ago. The problem is controlled. Recent lipid tests were reviewed and are normal. Exacerbating diseases include obesity. Risk factors for coronary artery disease include dyslipidemia, hypertension, a sedentary lifestyle and post-menopausal.  Arthritis Presents for follow-up visit. She complains of pain, stiffness and joint warmth. Affected locations include the left knee and right knee. Her pain is at a severity of 7/10.      Review of Systems  Musculoskeletal: Positive for arthritis and stiffness.  All other systems reviewed and are negative.      Objective:   Physical Exam Vitals reviewed.  Constitutional:      General: She is not in acute distress.    Appearance: She is well-developed.  HENT:     Head: Normocephalic and atraumatic.     Right Ear: Tympanic membrane normal.     Left Ear: Tympanic membrane normal.  Eyes:     Pupils: Pupils are equal, round, and reactive to light.  Neck:     Thyroid: No thyromegaly.  Cardiovascular:     Rate and Rhythm: Normal rate and regular rhythm.     Heart sounds: Normal heart sounds. No murmur heard.   Pulmonary:     Effort: Pulmonary effort is normal. No respiratory distress.     Breath sounds: Normal breath sounds. No wheezing.  Abdominal:     General: Bowel sounds are normal. There is no distension.     Palpations: Abdomen is soft.     Tenderness: There is no abdominal  tenderness.  Musculoskeletal:        General: No tenderness. Normal range of motion.     Cervical back: Normal range of motion and neck supple.  Skin:    General: Skin is warm and dry.  Neurological:     Mental Status: She is alert and oriented to person, place, and time.     Cranial Nerves: No cranial nerve deficit.     Deep Tendon Reflexes: Reflexes are normal and symmetric.  Psychiatric:        Behavior: Behavior normal.        Thought Content: Thought content normal.        Judgment: Judgment normal.       BP 108/65   Pulse 70   Temp 97.8 F (36.6 C) (Temporal)   Ht 5\' 8"  (1.727 m)   Wt 194 lb 3.2 oz (88.1 kg)   SpO2 95%   BMI 29.53 kg/m      Assessment & Plan:  Lori Livingston comes in today with chief complaint of Medical Management of Chronic Issues (6 mth, not fasting ) and Hyperlipidemia   Diagnosis and orders addressed:  1. Arthritis of knee, right - meloxicam (MOBIC) 15 MG tablet; Take 1 tablet (15 mg total) by mouth daily.  Dispense: 90 tablet; Refill: 3  2. Hyperlipidemia, unspecified hyperlipidemia type  3. Overweight (BMI 25.0-29.9)  4.  Menopausal syndrome on hormone replacement therapy  5. Vitamin D deficiency  Will get labs at work and will scan into chart  Health Maintenance reviewed Diet and exercise encouraged  Follow up plan: 4 months    Evelina Dun, FNP

## 2020-09-24 DIAGNOSIS — Z23 Encounter for immunization: Secondary | ICD-10-CM | POA: Diagnosis not present

## 2020-10-08 DIAGNOSIS — M1712 Unilateral primary osteoarthritis, left knee: Secondary | ICD-10-CM | POA: Diagnosis not present

## 2020-10-24 DIAGNOSIS — Z13820 Encounter for screening for osteoporosis: Secondary | ICD-10-CM | POA: Diagnosis not present

## 2020-10-24 DIAGNOSIS — Z01419 Encounter for gynecological examination (general) (routine) without abnormal findings: Secondary | ICD-10-CM | POA: Diagnosis not present

## 2020-10-24 DIAGNOSIS — Z6830 Body mass index (BMI) 30.0-30.9, adult: Secondary | ICD-10-CM | POA: Diagnosis not present

## 2020-11-12 DIAGNOSIS — M25562 Pain in left knee: Secondary | ICD-10-CM | POA: Diagnosis not present

## 2020-11-26 DIAGNOSIS — M25562 Pain in left knee: Secondary | ICD-10-CM | POA: Diagnosis not present

## 2020-11-30 DIAGNOSIS — S83242A Other tear of medial meniscus, current injury, left knee, initial encounter: Secondary | ICD-10-CM | POA: Diagnosis not present

## 2020-12-15 DIAGNOSIS — J189 Pneumonia, unspecified organism: Secondary | ICD-10-CM | POA: Diagnosis not present

## 2020-12-18 ENCOUNTER — Telehealth: Payer: Self-pay

## 2020-12-18 NOTE — Telephone Encounter (Signed)
Spoke with patient, she went to [facility] ER and was diagnosed with pneumonia, covid testing was not performed.  She was placed on antibiotics, cough medication and prednisone.  ER follow up appointment scheduled with Jannifer Rodney on 12/28/2020.

## 2020-12-28 ENCOUNTER — Other Ambulatory Visit: Payer: Self-pay

## 2020-12-28 ENCOUNTER — Ambulatory Visit (INDEPENDENT_AMBULATORY_CARE_PROVIDER_SITE_OTHER): Payer: BC Managed Care – PPO

## 2020-12-28 ENCOUNTER — Encounter: Payer: Self-pay | Admitting: Family

## 2020-12-28 ENCOUNTER — Ambulatory Visit: Payer: BC Managed Care – PPO | Admitting: Family

## 2020-12-28 VITALS — BP 119/69 | HR 74 | Temp 97.4°F | Ht 68.0 in | Wt 190.2 lb

## 2020-12-28 DIAGNOSIS — J189 Pneumonia, unspecified organism: Secondary | ICD-10-CM | POA: Diagnosis not present

## 2020-12-28 DIAGNOSIS — Z09 Encounter for follow-up examination after completed treatment for conditions other than malignant neoplasm: Secondary | ICD-10-CM

## 2020-12-28 MED ORDER — BENZONATATE 200 MG PO CAPS: 200.0000 mg | ORAL_CAPSULE | Freq: Three times a day (TID) | ORAL | 1 refills | Status: DC | PRN

## 2020-12-28 NOTE — Patient Instructions (Signed)

## 2020-12-28 NOTE — Progress Notes (Signed)
Subjective:    Patient ID: FREYA ZOBRIST, female    DOB: [birth year], 65 y.o.   MRN: [id number]  Chief Complaint  Patient presents with  . Pneumonia    Follow from hospital    Pt presents to the office today for hospital follow up. She went to [facility] on 12/15/20 with cough and SOB. She was diagnosed with pneumonia.  She was negative for strep.   She was discharge with Augmentin for 10 days, steroids, and tessalon. She reports she is feeling much better. She reports the first few days at work was a struggle, but getting her strength back.  Pneumonia She complains of cough. There is no chest tightness or wheezing. The cough is productive of sputum (clear). Associated symptoms include dyspnea on exertion (slight). Pertinent negatives include no ear pain, fever, myalgias, nasal congestion, orthopnea, postnasal drip, sore throat, sweats or trouble swallowing. Associated symptoms comments: wheezing. Her symptoms are alleviated by rest and oral steroids.      Review of Systems  Constitutional: Negative for fever.  HENT: Negative for ear pain, postnasal drip, sore throat and trouble swallowing.   Respiratory: Positive for cough. Negative for wheezing.   Cardiovascular: Positive for dyspnea on exertion (slight).  Musculoskeletal: Negative for myalgias.  All other systems reviewed and are negative.      Objective:   Physical Exam Vitals reviewed.  Constitutional:      General: She is not in acute distress.    Appearance: She is well-developed and well-nourished.  HENT:     Head: Normocephalic and atraumatic.     Right Ear: Tympanic membrane normal.     Left Ear: Tympanic membrane normal.     Mouth/Throat:     Mouth: Oropharynx is clear and moist.  Eyes:     Pupils: Pupils are equal, round, and reactive to light.  Neck:     Thyroid: No thyromegaly.  Cardiovascular:     Rate and Rhythm: Normal rate and regular rhythm.     Pulses: Intact distal pulses.     Heart sounds:  Normal heart sounds. No murmur heard.   Pulmonary:     Effort: Pulmonary effort is normal. No respiratory distress.     Breath sounds: Normal breath sounds. No wheezing.     Comments: Intermittent coarse nonproductive cough Abdominal:     General: Bowel sounds are normal. There is no distension.     Palpations: Abdomen is soft.     Tenderness: There is no abdominal tenderness.  Musculoskeletal:        General: No tenderness or edema. Normal range of motion.     Cervical back: Normal range of motion and neck supple.  Skin:    General: Skin is warm and dry.  Neurological:     Mental Status: She is alert and oriented to person, place, and time.     Cranial Nerves: No cranial nerve deficit.     Deep Tendon Reflexes: Reflexes are normal and symmetric.  Psychiatric:        Mood and Affect: Mood and affect normal.        Behavior: Behavior normal.        Thought Content: Thought content normal.        Judgment: Judgment normal.     BP 119/69   Pulse 74   Temp (!) 97.4 F (36.3 C) (Temporal)   Ht 5\' 8"  (1.727 m)   Wt 190 lb 3.2 oz (86.3 kg)   SpO2 93%  BMI 28.92 kg/m        Assessment & Plan:  DUANA BENEDICT comes in today with chief complaint of Pneumonia (Follow from hospital )   Diagnosis and orders addressed:  1. Hospital discharge follow-up - DG Chest 2 View; Future  2. Community acquired pneumonia of right lung, unspecified part of lung Improving  Force fluids Tylenol  Encourage exercise  - DG Chest 2 View; Future      Evelina Dun, FNP

## 2021-01-10 DIAGNOSIS — X58XXXA Exposure to other specified factors, initial encounter: Secondary | ICD-10-CM | POA: Diagnosis not present

## 2021-01-10 DIAGNOSIS — S83242A Other tear of medial meniscus, current injury, left knee, initial encounter: Secondary | ICD-10-CM | POA: Diagnosis not present

## 2021-01-10 DIAGNOSIS — S83232A Complex tear of medial meniscus, current injury, left knee, initial encounter: Secondary | ICD-10-CM | POA: Diagnosis not present

## 2021-01-10 DIAGNOSIS — M94262 Chondromalacia, left knee: Secondary | ICD-10-CM | POA: Diagnosis not present

## 2021-01-10 DIAGNOSIS — Y999 Unspecified external cause status: Secondary | ICD-10-CM | POA: Diagnosis not present

## 2021-01-23 DIAGNOSIS — S83242D Other tear of medial meniscus, current injury, left knee, subsequent encounter: Secondary | ICD-10-CM | POA: Diagnosis not present

## 2021-02-05 ENCOUNTER — Ambulatory Visit: Payer: BC Managed Care – PPO | Admitting: Family

## 2021-02-20 ENCOUNTER — Other Ambulatory Visit: Payer: Self-pay | Admitting: Family

## 2021-02-20 DIAGNOSIS — E785 Hyperlipidemia, unspecified: Secondary | ICD-10-CM

## 2021-02-20 NOTE — Telephone Encounter (Signed)
  Prescription Request  02/20/2021  What is the name of the medication or equipment? Pravastatin  Have you contacted your pharmacy to request a refill? (if applicable) does not want it sent to [facility] anymore   Which pharmacy would you like this sent to? [facility]   Patient notified that their request is being sent to the clinical staff for review and that they should receive a response within 2 business days.

## 2021-02-21 MED ORDER — PRAVASTATIN SODIUM 40 MG PO TABS: 40.0000 mg | ORAL_TABLET | Freq: Every day | ORAL | 0 refills | Status: DC

## 2021-02-21 NOTE — Telephone Encounter (Signed)
Pt aware refused refill to [facility] & sent to [facility]

## 2021-03-27 DIAGNOSIS — S83242D Other tear of medial meniscus, current injury, left knee, subsequent encounter: Secondary | ICD-10-CM | POA: Diagnosis not present

## 2021-04-15 ENCOUNTER — Encounter: Payer: Self-pay | Admitting: Family Medicine

## 2021-05-06 ENCOUNTER — Ambulatory Visit (INDEPENDENT_AMBULATORY_CARE_PROVIDER_SITE_OTHER): Payer: Medicare Other | Admitting: Family

## 2021-05-06 ENCOUNTER — Other Ambulatory Visit: Payer: Self-pay

## 2021-05-06 ENCOUNTER — Encounter: Payer: Self-pay | Admitting: Family

## 2021-05-06 VITALS — BP 130/75 | HR 66 | Temp 97.4°F | Ht 68.0 in | Wt 194.0 lb

## 2021-05-06 DIAGNOSIS — E663 Overweight: Secondary | ICD-10-CM

## 2021-05-06 DIAGNOSIS — E785 Hyperlipidemia, unspecified: Secondary | ICD-10-CM

## 2021-05-06 DIAGNOSIS — N951 Menopausal and female climacteric states: Secondary | ICD-10-CM

## 2021-05-06 DIAGNOSIS — Z79890 Hormone replacement therapy (postmenopausal): Secondary | ICD-10-CM

## 2021-05-06 DIAGNOSIS — M1711 Unilateral primary osteoarthritis, right knee: Secondary | ICD-10-CM

## 2021-05-06 DIAGNOSIS — E559 Vitamin D deficiency, unspecified: Secondary | ICD-10-CM

## 2021-05-06 NOTE — Progress Notes (Signed)
Subjective:    Patient ID: Lori Livingston, female    DOB: [birth year], 65 y.o.   MRN: [id number]  Chief Complaint  Patient presents with  . Medical Management of Chronic Issues    PT presents to the office today for chronic follow up.PT is followed by gynecologists every year. Pt is on Estrace after her hysterectomy in 2010 for hot flashes.Pt is followed by Ortho for bilateral knee pain. She had a right knee replaced in  2019.  She reports she retired two weeks.  Arthritis Presents for follow-up visit. She complains of pain and stiffness. The symptoms have been stable. Affected locations include the left knee, right knee and neck. Her pain is at a severity of 3/10.  Hyperlipidemia This is a chronic problem. The current episode started more than 1 year ago. The problem is controlled. Recent lipid tests were reviewed and are normal. Exacerbating diseases include obesity. Current antihyperlipidemic treatment includes statins. The current treatment provides moderate improvement of lipids. Risk factors for coronary artery disease include dyslipidemia, hypertension and a sedentary lifestyle.      Review of Systems  Musculoskeletal: Positive for arthritis and stiffness.  All other systems reviewed and are negative.  Family History  Problem Relation Age of Onset  . Heart disease Mother   . Diabetes Mother   . Heart attack Mother 68  . Stroke Father   . Cancer Brother        melanoma   Social History   Socioeconomic History  . Marital status: Married    Spouse name: Not on file  . Number of children: Not on file  . Years of education: Not on file  . Highest education level: Not on file  Occupational History  . Not on file  Tobacco Use  . Smoking status: Former Smoker    Types: Cigarettes    Quit date: 07/06/1978    Years since quitting: 42.8  . Smokeless tobacco: Never Used  Vaping Use  . Vaping Use: Never used  Substance and Sexual Activity  . Alcohol use: No  . Drug  use: No  . Sexual activity: Not on file  Other Topics Concern  . Not on file  Social History Narrative  . Not on file   Social Determinants of Health   Financial Resource Strain: Not on file  Food Insecurity: Not on file  Transportation Needs: Not on file  Physical Activity: Not on file  Stress: Not on file  Social Connections: Not on file        Objective:   Physical Exam Vitals reviewed.  Constitutional:      General: She is not in acute distress.    Appearance: She is well-developed.  HENT:     Head: Normocephalic and atraumatic.     Right Ear: Tympanic membrane normal.     Left Ear: Tympanic membrane normal.  Eyes:     Pupils: Pupils are equal, round, and reactive to light.  Neck:     Thyroid: No thyromegaly.  Cardiovascular:     Rate and Rhythm: Normal rate and regular rhythm.     Heart sounds: Normal heart sounds. No murmur heard.   Pulmonary:     Effort: Pulmonary effort is normal. No respiratory distress.     Breath sounds: Normal breath sounds. No wheezing.  Abdominal:     General: Bowel sounds are normal. There is no distension.     Palpations: Abdomen is soft.     Tenderness: There is no abdominal  tenderness.  Musculoskeletal:        General: No tenderness. Normal range of motion.     Cervical back: Normal range of motion and neck supple.  Skin:    General: Skin is warm and dry.  Neurological:     Mental Status: She is alert and oriented to person, place, and time.     Cranial Nerves: No cranial nerve deficit.     Deep Tendon Reflexes: Reflexes are normal and symmetric.  Psychiatric:        Behavior: Behavior normal.        Thought Content: Thought content normal.        Judgment: Judgment normal.       BP 130/75   Pulse 66   Temp (!) 97.4 F (36.3 C) (Temporal)   Ht _0  (1.727 m)   Wt 194 lb (88 kg)   BMI 29.50 kg/m      Assessment & Plan:  Lori Livingston comes in today with chief complaint of Medical Management of Chronic  Issues   Diagnosis and orders addressed:  1. Arthritis of knee, right - CMP14+EGFR - CBC with Differential/Platelet  2. Overweight (BMI 25.0-29.9) - CMP14+EGFR - CBC with Differential/Platelet  3. Menopausal syndrome on hormone replacement therapy - CMP14+EGFR - CBC with Differential/Platelet  4. Hyperlipidemia, unspecified hyperlipidemia type - CMP14+EGFR - CBC with Differential/Platelet - Lipid panel  5. Vitamin D deficiency - CMP14+EGFR - CBC with Differential/Platelet - VITAMIN D 25 Hydroxy (Vit-D Deficiency, Fractures)   Labs pending Health Maintenance reviewed Diet and exercise encouraged  Follow up plan: 6 months    Evelina Dun, FNP

## 2021-05-06 NOTE — Patient Instructions (Signed)
Health Maintenance, Female Adopting a healthy lifestyle and getting preventive care are important in promoting health and wellness. Ask your health care provider about:  The right schedule for you to have regular tests and exams.  Things you can do on your own to prevent diseases and keep yourself healthy. What should I know about diet, weight, and exercise? Eat a healthy diet  Eat a diet that includes plenty of vegetables, fruits, low-fat dairy products, and lean protein.  Do not eat a lot of foods that are high in solid fats, added sugars, or sodium.   Maintain a healthy weight Body mass index (BMI) is used to identify weight problems. It estimates body fat based on height and weight. Your health care provider can help determine your BMI and help you achieve or maintain a healthy weight. Get regular exercise Get regular exercise. This is one of the most important things you can do for your health. Most adults should:  Exercise for at least 150 minutes each week. The exercise should increase your heart rate and make you sweat (moderate-intensity exercise).  Do strengthening exercises at least twice a week. This is in addition to the moderate-intensity exercise.  Spend less time sitting. Even light physical activity can be beneficial. Watch cholesterol and blood lipids Have your blood tested for lipids and cholesterol at 65 years of age, then have this test every 5 years. Have your cholesterol levels checked more often if:  Your lipid or cholesterol levels are high.  You are older than 65 years of age.  You are at high risk for heart disease. What should I know about cancer screening? Depending on your health history and family history, you may need to have cancer screening at various ages. This may include screening for:  Breast cancer.  Cervical cancer.  Colorectal cancer.  Skin cancer.  Lung cancer. What should I know about heart disease, diabetes, and high blood  pressure? Blood pressure and heart disease  High blood pressure causes heart disease and increases the risk of stroke. This is more likely to develop in people who have high blood pressure readings, are of African descent, or are overweight.  Have your blood pressure checked: ? Every 3-5 years if you are 18-39 years of age. ? Every year if you are 40 years old or older. Diabetes Have regular diabetes screenings. This checks your fasting blood sugar level. Have the screening done:  Once every three years after age 40 if you are at a normal weight and have a low risk for diabetes.  More often and at a younger age if you are overweight or have a high risk for diabetes. What should I know about preventing infection? Hepatitis B If you have a higher risk for hepatitis B, you should be screened for this virus. Talk with your health care provider to find out if you are at risk for hepatitis B infection. Hepatitis C Testing is recommended for:  Everyone born from 1945 through 1965.  Anyone with known risk factors for hepatitis C. Sexually transmitted infections (STIs)  Get screened for STIs, including gonorrhea and chlamydia, if: ? You are sexually active and are younger than 65 years of age. ? You are older than 65 years of age and your health care provider tells you that you are at risk for this type of infection. ? Your sexual activity has changed since you were last screened, and you are at increased risk for chlamydia or gonorrhea. Ask your health care provider   if you are at risk.  Ask your health care provider about whether you are at high risk for HIV. Your health care provider may recommend a prescription medicine to help prevent HIV infection. If you choose to take medicine to prevent HIV, you should first get tested for HIV. You should then be tested every 3 months for as long as you are taking the medicine. Pregnancy  If you are about to stop having your period (premenopausal) and  you may become pregnant, seek counseling before you get pregnant.  Take 400 to 800 micrograms (mcg) of folic acid every day if you become pregnant.  Ask for birth control (contraception) if you want to prevent pregnancy. Osteoporosis and menopause Osteoporosis is a disease in which the bones lose minerals and strength with aging. This can result in bone fractures. If you are 65 years old or older, or if you are at risk for osteoporosis and fractures, ask your health care provider if you should:  Be screened for bone loss.  Take a calcium or vitamin D supplement to lower your risk of fractures.  Be given hormone replacement therapy (HRT) to treat symptoms of menopause. Follow these instructions at home: Lifestyle  Do not use any products that contain nicotine or tobacco, such as cigarettes, e-cigarettes, and chewing tobacco. If you need help quitting, ask your health care provider.  Do not use street drugs.  Do not share needles.  Ask your health care provider for help if you need support or information about quitting drugs. Alcohol use  Do not drink alcohol if: ? Your health care provider tells you not to drink. ? You are pregnant, may be pregnant, or are planning to become pregnant.  If you drink alcohol: ? Limit how much you use to 0-1 drink a day. ? Limit intake if you are breastfeeding.  Be aware of how much alcohol is in your drink. In the [place] one drink equals one 12 oz bottle of beer (355 mL), one 5 oz glass of wine (148 mL), or one 1 oz glass of hard liquor (44 mL). General instructions  Schedule regular health, dental, and eye exams.  Stay current with your vaccines.  Tell your health care provider if: ? You often feel depressed. ? You have ever been abused or do not feel safe at home. Summary  Adopting a healthy lifestyle and getting preventive care are important in promoting health and wellness.  Follow your health care provider's instructions about healthy  diet, exercising, and getting tested or screened for diseases.  Follow your health care provider's instructions on monitoring your cholesterol and blood pressure. This information is not intended to replace advice given to you by your health care provider. Make sure you discuss any questions you have with your health care provider. Document Revised: 11/24/2018 Document Reviewed: 11/24/2018 Elsevier Patient Education  2021 [employer] Inc.  

## 2021-05-07 LAB — CBC WITH DIFFERENTIAL/PLATELET
Basophils Absolute: 0.1 10*3/uL (ref 0.0–0.2)
Basos: 1 %
EOS (ABSOLUTE): 0.3 10*3/uL (ref 0.0–0.4)
Eos: 3 %
Hematocrit: 43.5 % (ref 34.0–46.6)
Hemoglobin: 15.1 g/dL (ref 11.1–15.9)
Immature Grans (Abs): 0 10*3/uL (ref 0.0–0.1)
Immature Granulocytes: 0 %
Lymphocytes Absolute: 3.3 10*3/uL — ABNORMAL HIGH (ref 0.7–3.1)
Lymphs: 39 %
MCH: 32.5 pg (ref 26.6–33.0)
MCHC: 34.7 g/dL (ref 31.5–35.7)
MCV: 94 fL (ref 79–97)
Monocytes Absolute: 0.8 10*3/uL (ref 0.1–0.9)
Monocytes: 10 %
Neutrophils Absolute: 3.9 10*3/uL (ref 1.4–7.0)
Neutrophils: 47 %
Platelets: 359 10*3/uL (ref 150–450)
RBC: 4.65 x10E6/uL (ref 3.77–5.28)
RDW: 12.2 % (ref 11.7–15.4)
WBC: 8.4 10*3/uL (ref 3.4–10.8)

## 2021-05-07 LAB — CMP14+EGFR
ALT: 19 IU/L (ref 0–32)
AST: 19 IU/L (ref 0–40)
Albumin/Globulin Ratio: 1.9 (ref 1.2–2.2)
Albumin: 4.5 g/dL (ref 3.8–4.8)
Alkaline Phosphatase: 73 IU/L (ref 44–121)
BUN/Creatinine Ratio: 19 (ref 12–28)
BUN: 12 mg/dL (ref 8–27)
Bilirubin Total: 1 mg/dL (ref 0.0–1.2)
CO2: 23 mmol/L (ref 20–29)
Calcium: 9.7 mg/dL (ref 8.7–10.3)
Chloride: 105 mmol/L (ref 96–106)
Creatinine, Ser: 0.63 mg/dL (ref 0.57–1.00)
Globulin, Total: 2.4 g/dL (ref 1.5–4.5)
Glucose: 122 mg/dL — ABNORMAL HIGH (ref 65–99)
Potassium: 4.1 mmol/L (ref 3.5–5.2)
Sodium: 145 mmol/L — ABNORMAL HIGH (ref 134–144)
Total Protein: 6.9 g/dL (ref 6.0–8.5)
eGFR: 98 mL/min/{1.73_m2} (ref >59)

## 2021-05-07 LAB — LIPID PANEL
Chol/HDL Ratio: 3.3 ratio (ref 0.0–4.4)
Cholesterol, Total: 147 mg/dL (ref 100–199)
HDL: 44 mg/dL (ref >39)
LDL Chol Calc (NIH): 75 mg/dL (ref 0–99)
Triglycerides: 164 mg/dL — ABNORMAL HIGH (ref 0–149)
VLDL Cholesterol Cal: 28 mg/dL (ref 5–40)

## 2021-05-07 LAB — VITAMIN D 25 HYDROXY (VIT D DEFICIENCY, FRACTURES): Vit D, 25-Hydroxy: 42.2 ng/mL (ref 30.0–100.0)

## 2021-05-22 ENCOUNTER — Other Ambulatory Visit: Payer: Self-pay

## 2021-05-22 ENCOUNTER — Encounter: Payer: Self-pay | Admitting: Family Medicine

## 2021-05-22 ENCOUNTER — Ambulatory Visit (INDEPENDENT_AMBULATORY_CARE_PROVIDER_SITE_OTHER): Payer: Medicare Other | Admitting: Family Medicine

## 2021-05-22 VITALS — BP 108/68 | HR 72 | Temp 97.8°F | Ht 68.0 in | Wt 196.2 lb

## 2021-05-22 DIAGNOSIS — S39012A Strain of muscle, fascia and tendon of lower back, initial encounter: Secondary | ICD-10-CM

## 2021-05-22 MED ORDER — METHYLPREDNISOLONE ACETATE 40 MG/ML IJ SUSP
80.0000 mg | Freq: Once | INTRAMUSCULAR | Status: AC
  Administered 2021-05-22: 80 mg via INTRAMUSCULAR

## 2021-05-22 MED ORDER — TIZANIDINE HCL 4 MG PO CAPS: 4.0000 mg | ORAL_CAPSULE | Freq: Three times a day (TID) | ORAL | 0 refills | Status: DC | PRN

## 2021-05-22 NOTE — Patient Instructions (Addendum)
Lumbar Strain A lumbar strain, which is sometimes called a low-back strain, is a stretch or tear in a muscle or the strong cords of tissue that attach muscle to bone (tendons) in the lower back (lumbar spine). This type of injury occurs when muscles or tendons are torn or are stretched beyond their limits. Lumbar strains can range from mild to severe. Mild strains may involve stretching a muscle or tendon without tearing it. These may heal in 1-2 weeks. More severe strains involve tearing of muscle fibers or tendons. These will cause more pain and may take 6-8 weeks to heal. What are the causes? This condition may be caused by:  Trauma, such as a fall or a hit to the body.  Twisting or overstretching the back. This may result from doing activities that need a lot of energy, such as lifting heavy objects. What increases the risk? This injury is more common in:  Athletes.  People with obesity.  People who do repeated lifting, bending, or other movements that involve their back. What are the signs or symptoms? Symptoms of this condition may include:  Sharp or dull pain in the lower back that does not go away. The pain may extend to the buttocks.  Stiffness or limited range of motion.  Sudden muscle tightening (spasms). How is this diagnosed? This condition may be diagnosed based on:  Your symptoms.  Your medical history.  A physical exam.  Imaging tests, such as: ? X-rays. ? MRI. How is this treated? Treatment for this condition may include:  Rest.  Applying heat and cold to the affected area.  Over-the-counter medicines to help relieve pain and inflammation, such as NSAIDs.  Prescription pain medicine and muscle relaxants may be needed for a short time.  Physical therapy. Follow these instructions at home: Managing pain, stiffness, and swelling  If directed, put ice on the injured area during the first 24 hours after your injury. ? Put ice in a plastic bag. ? Place  a towel between your skin and the bag. ? Leave the ice on for 20 minutes, 2-3 times a day.  If directed, apply heat to the affected area as often as told by your health care provider. Use the heat source that your health care provider recommends, such as a moist heat pack or a heating pad. ? Place a towel between your skin and the heat source. ? Leave the heat on for 20-30 minutes. ? Remove the heat if your skin turns bright red. This is especially important if you are unable to feel pain, heat, or cold. You may have a greater risk of getting burned.      Activity  Rest and return to your normal activities as told by your health care provider. Ask your health care provider what activities are safe for you.  Do exercises as told by your health care provider. Medicines  Take over-the-counter and prescription medicines only as told by your health care provider.  Ask your health care provider if the medicine prescribed to you: ? Requires you to avoid driving or using heavy machinery. ? Can cause constipation. You may need to take these actions to prevent or treat constipation:  Drink enough fluid to keep your urine pale yellow.  Take over-the-counter or prescription medicines.  Eat foods that are high in fiber, such as beans, whole grains, and fresh fruits and vegetables.  Limit foods that are high in fat and processed sugars, such as fried or sweet foods. Injury prevention To   prevent a future low-back injury:  Always warm up properly before physical activity or sports.  Cool down and stretch after being active.  Use correct form when playing sports and lifting heavy objects. Bend your knees before you lift heavy objects.  Use good posture when sitting and standing.  Stay physically fit and keep a healthy weight. ? Do at least 150 minutes of moderate-intensity exercise each week, such as brisk walking or water aerobics. ? Do strength exercises at least 2 times each week.    General instructions  Do not use any products that contain nicotine or tobacco, such as cigarettes, e-cigarettes, and chewing tobacco. If you need help quitting, ask your health care provider.  Keep all follow-up visits as told by your health care provider. This is important. Contact a health care provider if:  Your back pain does not improve after 6 weeks of treatment.  Your symptoms get worse. Get help right away if:  Your back pain is severe.  You are unable to stand or walk.  You develop pain in your legs.  You develop weakness in your buttocks or legs.  You have difficulty controlling when you urinate or when you have a bowel movement. ? You have frequent, painful, or bloody urination. ? You have a temperature over 101.0F (38.3C) Summary  A lumbar strain, which is sometimes called a low-back strain, is a stretch or tear in a muscle or the strong cords of tissue that attach muscle to bone (tendons) in the lower back (lumbar spine).  This type of injury occurs when muscles or tendons are torn or are stretched beyond their limits.  Rest and return to your normal activities as told by your health care provider. If directed, apply heat and ice to the affected area as often as told by your health care provider.  Take over-the-counter and prescription medicines only as told by your health care provider.  Contact a health care provider if you have new or worsening symptoms. This information is not intended to replace advice given to you by your health care provider. Make sure you discuss any questions you have with your health care provider. Document Revised: 09/30/2018 Document Reviewed: 09/30/2018 Elsevier Patient Education  2021 [employer] Inc.    Back Exercises The following exercises strengthen the muscles that help to support the trunk and back. They also help to keep the lower back flexible. Doing these exercises can help to prevent back pain or lessen existing  pain.  If you have back pain or discomfort, try doing these exercises 2-3 times each day or as told by your health care provider.  As your pain improves, do them once each day, but increase the number of times that you repeat the steps for each exercise (do more repetitions).  To prevent the recurrence of back pain, continue to do these exercises once each day or as told by your health care provider. Do exercises exactly as told by your health care provider and adjust them as directed. It is normal to feel mild stretching, pulling, tightness, or discomfort as you do these exercises, but you should stop right away if you feel sudden pain or your pain gets worse. Exercises Single knee to chest Repeat these steps 3-5 times for each leg: 1. Lie on your back on a firm bed or the floor with your legs extended. 2. Bring one knee to your chest. Your other leg should stay extended and in contact with the floor. 3. Hold your knee in   place by grabbing your knee or thigh with both hands and hold. 4. Pull on your knee until you feel a gentle stretch in your lower back or buttocks. 5. Hold the stretch for 10-30 seconds. 6. Slowly release and straighten your leg. Pelvic tilt Repeat these steps 5-10 times: 1. Lie on your back on a firm bed or the floor with your legs extended. 2. Bend your knees so they are pointing toward the ceiling and your feet are flat on the floor. 3. Tighten your lower abdominal muscles to press your lower back against the floor. This motion will tilt your pelvis so your tailbone points up toward the ceiling instead of pointing to your feet or the floor. 4. With gentle tension and even breathing, hold this position for 5-10 seconds. Cat-cow Repeat these steps until your lower back becomes more flexible: 1. Get into a hands-and-knees position on a firm surface. Keep your hands under your shoulders, and keep your knees under your hips. You may place padding under your knees for  comfort. 2. Let your head hang down toward your chest. Contract your abdominal muscles and point your tailbone toward the floor so your lower back becomes rounded like the back of a cat. 3. Hold this position for 5 seconds. 4. Slowly lift your head, let your abdominal muscles relax and point your tailbone up toward the ceiling so your back forms a sagging arch like the back of a cow. 5. Hold this position for 5 seconds.   Press-ups Repeat these steps 5-10 times: 1. Lie on your abdomen (face-down) on the floor. 2. Place your palms near your head, about shoulder-width apart. 3. Keeping your back as relaxed as possible and keeping your hips on the floor, slowly straighten your arms to raise the top half of your body and lift your shoulders. Do not use your back muscles to raise your upper torso. You may adjust the placement of your hands to make yourself more comfortable. 4. Hold this position for 5 seconds while you keep your back relaxed. 5. Slowly return to lying flat on the floor.   Bridges Repeat these steps 10 times: 1. Lie on your back on a firm surface. 2. Bend your knees so they are pointing toward the ceiling and your feet are flat on the floor. Your arms should be flat at your sides, next to your body. 3. Tighten your buttocks muscles and lift your buttocks off the floor until your waist is at almost the same height as your knees. You should feel the muscles working in your buttocks and the back of your thighs. If you do not feel these muscles, slide your feet 1-2 inches farther away from your buttocks. 4. Hold this position for 3-5 seconds. 5. Slowly lower your hips to the starting position, and allow your buttocks muscles to relax completely. If this exercise is too easy, try doing it with your arms crossed over your chest.   Abdominal crunches Repeat these steps 5-10 times: 1. Lie on your back on a firm bed or the floor with your legs extended. 2. Bend your knees so they are  pointing toward the ceiling and your feet are flat on the floor. 3. Cross your arms over your chest. 4. Tip your chin slightly toward your chest without bending your neck. 5. Tighten your abdominal muscles and slowly raise your trunk (torso) high enough to lift your shoulder blades a tiny bit off the floor. Avoid raising your torso higher than that because   it can put too much stress on your low back and does not help to strengthen your abdominal muscles. 6. Slowly return to your starting position. Back lifts Repeat these steps 5-10 times: 1. Lie on your abdomen (face-down) with your arms at your sides, and rest your forehead on the floor. 2. Tighten the muscles in your legs and your buttocks. 3. Slowly lift your chest off the floor while you keep your hips pressed to the floor. Keep the back of your head in line with the curve in your back. Your eyes should be looking at the floor. 4. Hold this position for 3-5 seconds. 5. Slowly return to your starting position. Contact a health care provider if:  Your back pain or discomfort gets much worse when you do an exercise.  Your worsening back pain or discomfort does not lessen within 2 hours after you exercise. If you have any of these problems, stop doing these exercises right away. Do not do them again unless your health care provider says that you can. Get help right away if:  You develop sudden, severe back pain. If this happens, stop doing the exercises right away. Do not do them again unless your health care provider says that you can. This information is not intended to replace advice given to you by your health care provider. Make sure you discuss any questions you have with your health care provider. Document Revised: 04/07/2019 Document Reviewed: 09/02/2018 Elsevier Patient Education  2021 [employer] Inc.   

## 2021-05-22 NOTE — Progress Notes (Signed)
Acute Office Visit  Subjective:    Patient ID: Lori Livingston, female    DOB: 12-Jun-1956, 65 y.o.   MRN: [id number]  Chief Complaint  Patient presents with  . Back Pain    HPI Patient is in today for acute low back pain x 5 days. The pain is achy. The pain is constant. The pain is a 6/10. She is having muscle spasms. The pain is worse with sitting or getting into a new position. The pain is better with standing. She has been doing a lot of bending lately. She denies injury. Denies fever, chills, dysuria, nausea, vomiting, abdominal pain, numbness, tingling, saddle anesthesia, or weakness. She has tried mobic, tylenol, ice, heat, and a TENs unit with a little short term relief. Overall is pain is a little better than when it started.   Past Medical History:  Diagnosis Date  . Arthritis    Right knee  . Hyperlipidemia   . Neuromuscular disorder ([place])    Right knee meniscus tear  . Vitamin D deficiency   . Warts     Past Surgical History:  Procedure Laterality Date  . APPENDECTOMY  02/1988  . CHOLECYSTECTOMY  02/1988  . SHOULDER SURGERY Right   . TONSILLECTOMY  1964    Family History  Problem Relation Age of Onset  . Heart disease Mother   . Diabetes Mother   . Heart attack Mother 42  . Stroke Father   . Cancer Brother        melanoma    Social History   Socioeconomic History  . Marital status: Married    Spouse name: Not on file  . Number of children: Not on file  . Years of education: Not on file  . Highest education level: Not on file  Occupational History  . Not on file  Tobacco Use  . Smoking status: Former Smoker    Types: Cigarettes    Quit date: 07/06/1978    Years since quitting: 42.9  . Smokeless tobacco: Never Used  Vaping Use  . Vaping Use: Never used  Substance and Sexual Activity  . Alcohol use: No  . Drug use: No  . Sexual activity: Not on file  Other Topics Concern  . Not on file  Social History Narrative  . Not on file   Social  Determinants of Health   Financial Resource Strain: Not on file  Food Insecurity: Not on file  Transportation Needs: Not on file  Physical Activity: Not on file  Stress: Not on file  Social Connections: Not on file  Intimate Partner Violence: Not on file    Outpatient Medications Prior to Visit  Medication Sig Dispense Refill  . Cetirizine HCl (ZYRTEC ALLERGY PO) Take by mouth.    . clobetasol cream (TEMOVATE) 3.64 % Apply 1 application topically 2 (two) times daily. 30 g 5  . Coenzyme Q10 200 MG capsule Take 200 mg by mouth daily.    . Ergocalciferol (VITAMIN D2) 2000 UNITS TABS Take 2,000 Int'l Units by mouth daily.    Marland Kitchen estradiol (ESTRACE) 0.5 MG tablet     . meloxicam (MOBIC) 15 MG tablet Take 1 tablet (15 mg total) by mouth daily. 90 tablet 3  . Multiple Vitamin (MULTIVITAMIN) tablet Take 1 tablet by mouth daily.    . pravastatin (PRAVACHOL) 40 MG tablet Take 1 tablet (40 mg total) by mouth at bedtime. 90 tablet 0   No facility-administered medications prior to visit.    Allergies  Allergen  Reactions  . Celebrex [Celecoxib]   . Indomethacin   . Propoxyphene N-Acetaminophen   . Nickel Rash  . Propoxyphene Nausea Only    Review of Systems As per HPI.     Objective:    Physical Exam Vitals and nursing note reviewed.  Constitutional:      General: She is not in acute distress.    Appearance: Normal appearance. She is not ill-appearing, toxic-appearing or diaphoretic.  Pulmonary:     Effort: Pulmonary effort is normal. No respiratory distress.  Musculoskeletal:     Cervical back: Normal.     Thoracic back: Tenderness (paraspinal) present. No swelling, edema, deformity or bony tenderness.     Lumbar back: Tenderness (paraspinal) present. No swelling, edema, deformity or bony tenderness. Negative right straight leg raise test and negative left straight leg raise test.     Right lower leg: No edema.     Left lower leg: No edema.  Skin:    General: Skin is warm and  dry.  Neurological:     General: No focal deficit present.     Mental Status: She is alert and oriented to person, place, and time.  Psychiatric:        Mood and Affect: Mood normal.        Behavior: Behavior normal.     BP 108/68   Pulse 72   Temp 97.8 F (36.6 C) (Oral)   Ht 5' 8"  (1.727 m)   Wt 196 lb 4 oz (89 kg)   BMI 29.84 kg/m  Wt Readings from Last 3 Encounters:  05/22/21 196 lb 4 oz (89 kg)  05/06/21 194 lb (88 kg)  12/28/20 190 lb 3.2 oz (86.3 kg)    Health Maintenance Due  Topic Date Due  . Pneumococcal Vaccine 90-8 Years old (1 of 4 - PCV13) Never done  . Zoster Vaccines- Shingrix (1 of 2) Never done    There are no preventive care reminders to display for this patient.   Lab Results  Component Value Date   TSH 2.850 01/28/2018   Lab Results  Component Value Date   WBC 8.4 05/06/2021   HGB 15.1 05/06/2021   HCT 43.5 05/06/2021   MCV 94 05/06/2021   PLT 359 05/06/2021   Lab Results  Component Value Date   NA 145 (H) 05/06/2021   K 4.1 05/06/2021   CO2 23 05/06/2021   GLUCOSE 122 (H) 05/06/2021   BUN 12 05/06/2021   CREATININE 0.63 05/06/2021   BILITOT 1.0 05/06/2021   ALKPHOS 73 05/06/2021   AST 19 05/06/2021   ALT 19 05/06/2021   PROT 6.9 05/06/2021   ALBUMIN 4.5 05/06/2021   CALCIUM 9.7 05/06/2021   EGFR 98 05/06/2021   Lab Results  Component Value Date   CHOL 147 05/06/2021   Lab Results  Component Value Date   HDL 44 05/06/2021   Lab Results  Component Value Date   LDLCALC 75 05/06/2021   Lab Results  Component Value Date   TRIG 164 (H) 05/06/2021   Lab Results  Component Value Date   CHOLHDL 3.3 05/06/2021   Lab Results  Component Value Date   HGBA1C 5.8 (H) 08/10/2018       Assessment & Plan:   Lori Livingston was seen today for back pain.  Diagnoses and all orders for this visit:  Strain of lumbar region, initial encounter Continue mobic, heat, ice, rest, TENs unit as needed. Lori Livingston as needed for spasms. Steroid  IM injection today in office.  Handout given with back exercises.  -     tiZANidine (Lori Livingston) 4 MG capsule; Take 1 capsule (4 mg total) by mouth 3 (three) times daily as needed for muscle spasms. -     methylPREDNISolone acetate (DEPO-MEDROL) injection 80 mg   Return to office for new or worsening symptoms, or if symptoms persist.    The patient indicates understanding of these issues and agrees with the plan.   Gwenlyn Perking, FNP

## 2021-06-14 ENCOUNTER — Other Ambulatory Visit: Payer: Self-pay | Admitting: Family

## 2021-06-14 DIAGNOSIS — E785 Hyperlipidemia, unspecified: Secondary | ICD-10-CM

## 2021-07-10 DIAGNOSIS — R928 Other abnormal and inconclusive findings on diagnostic imaging of breast: Secondary | ICD-10-CM | POA: Diagnosis not present

## 2021-07-10 DIAGNOSIS — R922 Inconclusive mammogram: Secondary | ICD-10-CM | POA: Diagnosis not present

## 2021-07-10 DIAGNOSIS — N6011 Diffuse cystic mastopathy of right breast: Secondary | ICD-10-CM | POA: Diagnosis not present

## 2021-07-23 DIAGNOSIS — L720 Epidermal cyst: Secondary | ICD-10-CM | POA: Diagnosis not present

## 2021-07-23 DIAGNOSIS — L409 Psoriasis, unspecified: Secondary | ICD-10-CM | POA: Diagnosis not present

## 2021-07-23 DIAGNOSIS — D225 Melanocytic nevi of trunk: Secondary | ICD-10-CM | POA: Diagnosis not present

## 2021-07-23 DIAGNOSIS — D1801 Hemangioma of skin and subcutaneous tissue: Secondary | ICD-10-CM | POA: Diagnosis not present

## 2021-07-23 DIAGNOSIS — D2270 Melanocytic nevi of unspecified lower limb, including hip: Secondary | ICD-10-CM | POA: Diagnosis not present

## 2021-07-23 DIAGNOSIS — D2260 Melanocytic nevi of unspecified upper limb, including shoulder: Secondary | ICD-10-CM | POA: Diagnosis not present

## 2021-07-23 DIAGNOSIS — L821 Other seborrheic keratosis: Secondary | ICD-10-CM | POA: Diagnosis not present

## 2021-08-13 DIAGNOSIS — H40013 Open angle with borderline findings, low risk, bilateral: Secondary | ICD-10-CM | POA: Diagnosis not present

## 2021-08-13 DIAGNOSIS — H2513 Age-related nuclear cataract, bilateral: Secondary | ICD-10-CM | POA: Diagnosis not present

## 2021-08-14 ENCOUNTER — Other Ambulatory Visit: Payer: Self-pay | Admitting: Family

## 2021-08-14 DIAGNOSIS — M1711 Unilateral primary osteoarthritis, right knee: Secondary | ICD-10-CM

## 2021-11-04 ENCOUNTER — Encounter: Payer: Self-pay | Admitting: Family

## 2021-11-04 ENCOUNTER — Ambulatory Visit (INDEPENDENT_AMBULATORY_CARE_PROVIDER_SITE_OTHER): Payer: Medicare Other | Admitting: Family

## 2021-11-04 ENCOUNTER — Other Ambulatory Visit: Payer: Self-pay

## 2021-11-04 VITALS — BP 124/74 | HR 71 | Temp 97.3°F | Ht 68.0 in | Wt 200.0 lb

## 2021-11-04 DIAGNOSIS — M1711 Unilateral primary osteoarthritis, right knee: Secondary | ICD-10-CM | POA: Diagnosis not present

## 2021-11-04 DIAGNOSIS — E785 Hyperlipidemia, unspecified: Secondary | ICD-10-CM | POA: Diagnosis not present

## 2021-11-04 DIAGNOSIS — Z79890 Hormone replacement therapy (postmenopausal): Secondary | ICD-10-CM

## 2021-11-04 DIAGNOSIS — E559 Vitamin D deficiency, unspecified: Secondary | ICD-10-CM

## 2021-11-04 DIAGNOSIS — N951 Menopausal and female climacteric states: Secondary | ICD-10-CM | POA: Diagnosis not present

## 2021-11-04 DIAGNOSIS — R7309 Other abnormal glucose: Secondary | ICD-10-CM

## 2021-11-04 DIAGNOSIS — E669 Obesity, unspecified: Secondary | ICD-10-CM

## 2021-11-04 LAB — CMP14+EGFR
ALT: 16 IU/L (ref 0–32)
AST: 15 IU/L (ref 0–40)
Albumin/Globulin Ratio: 2.3 — ABNORMAL HIGH (ref 1.2–2.2)
Albumin: 4.5 g/dL (ref 3.8–4.8)
Alkaline Phosphatase: 66 IU/L (ref 44–121)
BUN/Creatinine Ratio: 23 (ref 12–28)
BUN: 14 mg/dL (ref 8–27)
Bilirubin Total: 0.9 mg/dL (ref 0.0–1.2)
CO2: 22 mmol/L (ref 20–29)
Calcium: 9.6 mg/dL (ref 8.7–10.3)
Chloride: 104 mmol/L (ref 96–106)
Creatinine, Ser: 0.6 mg/dL (ref 0.57–1.00)
Globulin, Total: 2 g/dL (ref 1.5–4.5)
Glucose: 129 mg/dL — ABNORMAL HIGH (ref 70–99)
Potassium: 4.1 mmol/L (ref 3.5–5.2)
Sodium: 140 mmol/L (ref 134–144)
Total Protein: 6.5 g/dL (ref 6.0–8.5)
eGFR: 100 mL/min/{1.73_m2} (ref >59)

## 2021-11-04 LAB — BAYER DCA HB A1C WAIVED: HB A1C (BAYER DCA - WAIVED): 6.1 % — ABNORMAL HIGH (ref 4.8–5.6)

## 2021-11-04 NOTE — Progress Notes (Signed)
Subjective:    Patient ID: Lori Livingston, female    DOB: 1955-12-30, 65 y.o.   MRN: [id number]  Chief Complaint  Patient presents with   Medical Management of Chronic Issues   PT presents to the office today for chronic follow up. PT is followed by gynecologists every year. Pt is on Estrace after her hysterectomy in 2010 for hot flashes. Pt is followed by Ortho for bilateral knee pain. She had a right knee replaced in  2019.   She reports she retired May 2022.   Last lab work her glucose was elevated.  Arthritis Presents for follow-up visit. She complains of pain and stiffness. The symptoms have been stable. Affected locations include the left knee, right knee, right MCP and left MCP. Her pain is at a severity of 4/10.  Hyperlipidemia This is a chronic problem. The current episode started more than 1 year ago. Exacerbating diseases include obesity. Current antihyperlipidemic treatment includes statins. The current treatment provides moderate improvement of lipids. Risk factors for coronary artery disease include dyslipidemia and a sedentary lifestyle.     Review of Systems  Musculoskeletal:  Positive for arthritis and stiffness.  All other systems reviewed and are negative.     Objective:   Physical Exam Vitals reviewed.  Constitutional:      General: She is not in acute distress.    Appearance: She is well-developed.  HENT:     Head: Normocephalic and atraumatic.     Right Ear: Tympanic membrane normal.     Left Ear: Tympanic membrane normal.  Eyes:     Pupils: Pupils are equal, round, and reactive to light.  Neck:     Thyroid: No thyromegaly.  Cardiovascular:     Rate and Rhythm: Normal rate and regular rhythm.     Heart sounds: Normal heart sounds. No murmur heard. Pulmonary:     Effort: Pulmonary effort is normal. No respiratory distress.     Breath sounds: Normal breath sounds. No wheezing.  Abdominal:     General: Bowel sounds are normal. There is no  distension.     Palpations: Abdomen is soft.     Tenderness: There is no abdominal tenderness.  Musculoskeletal:        General: No tenderness. Normal range of motion.     Cervical back: Normal range of motion and neck supple.  Skin:    General: Skin is warm and dry.  Neurological:     Mental Status: She is alert and oriented to person, place, and time.     Cranial Nerves: No cranial nerve deficit.     Deep Tendon Reflexes: Reflexes are normal and symmetric.  Psychiatric:        Behavior: Behavior normal.        Thought Content: Thought content normal.        Judgment: Judgment normal.      BP 124/74   Pulse 71   Temp (!) 97.3 F (36.3 C) (Temporal)   Ht 5' 8"  (1.727 m)   Wt 200 lb (90.7 kg)   BMI 30.41 kg/m      Assessment & Plan:  Lori Livingston comes in today with chief complaint of Medical Management of Chronic Issues   Diagnosis and orders addressed:  1. Arthritis of knee, right - CMP14+EGFR  2. Hyperlipidemia, unspecified hyperlipidemia type - CMP14+EGFR  3. Vitamin D deficiency - CMP14+EGFR  4. Menopausal syndrome on hormone replacement therapy - CMP14+EGFR  5. Obesity (BMI 30-39.9) - CMP14+EGFR  6. Elevated glucose  - CMP14+EGFR - Bayer DCA Hb A1c Waived   Labs pending Health Maintenance reviewed Diet and exercise encouraged  Follow up plan: 6 months    Lori Dun, FNP

## 2021-11-04 NOTE — Patient Instructions (Signed)

## 2021-12-04 ENCOUNTER — Encounter: Payer: Self-pay | Admitting: *Deleted

## 2021-12-21 ENCOUNTER — Other Ambulatory Visit: Payer: Self-pay | Admitting: Family

## 2021-12-21 DIAGNOSIS — E785 Hyperlipidemia, unspecified: Secondary | ICD-10-CM

## 2021-12-31 ENCOUNTER — Encounter: Payer: Medicare Other | Admitting: Family

## 2022-01-02 ENCOUNTER — Ambulatory Visit (INDEPENDENT_AMBULATORY_CARE_PROVIDER_SITE_OTHER): Payer: Medicare Other | Admitting: Family

## 2022-01-02 ENCOUNTER — Encounter: Payer: Self-pay | Admitting: Family

## 2022-01-02 VITALS — BP 116/65 | HR 69 | Temp 97.1°F | Ht 68.0 in | Wt 196.0 lb

## 2022-01-02 DIAGNOSIS — Z0000 Encounter for general adult medical examination without abnormal findings: Secondary | ICD-10-CM | POA: Diagnosis not present

## 2022-01-02 NOTE — Patient Instructions (Signed)
Tinnitus Tinnitus refers to hearing a sound when there is no actual source for that sound. This is often described as ringing in the ears. However, people with this condition may hear a variety of noises, in one ear or in both ears. The sounds of tinnitus can be soft, loud, or somewhere in between. Tinnitus can last for a few seconds or can be constant for days. It may go away without treatment and come back at various times. When tinnitus is constant or happens often, it can lead to other problems, such as trouble sleeping and trouble concentrating. Almost everyone experiences tinnitus at some point. Tinnitus is not the same as hearing loss. Tinnitus that is long-lasting (chronic) or comes back often (recurs) may require medical attention. What are the causes? The cause of tinnitus is often not known. In some cases, it can result from: Exposure to loud noises from machinery, music, or other sources. An object (foreign body) stuck in the ear. Earwax buildup. Drinking alcohol or caffeine. Taking certain medicines. Age-related hearing loss. It may also be caused by medical conditions such as: Ear or sinus infections. Heart diseases or high blood pressure. Allergies. Mnire's disease. Thyroid problems. Tumors. A weak, bulging blood vessel (aneurysm) near the ear. What increases the risk? The following factors may make you more likely to develop this condition: Exposure to loud noises. Age. Tinnitus is more likely in older individuals. Using alcohol or tobacco. What are the signs or symptoms? The main symptom of tinnitus is hearing a sound when there is no source for that sound. It may sound like: Buzzing. Sizzling. Ringing. Blowing air. Hissing. Whistling. Other sounds may include: Roaring. Running water. A musical note. Tapping. Humming. Symptoms may affect only one ear (unilateral) or both ears (bilateral). How is this diagnosed? Tinnitus is diagnosed based on your symptoms,  your medical history, and a physical exam. Your health care provider may do a thorough hearing test (audiologic exam) if your tinnitus: Is unilateral. Causes hearing difficulties. Lasts 6 months or longer. You may work with a health care provider who specializes in hearing disorders (audiologist). You may be asked questions about your symptoms and how they affect your daily life. You may have other tests done, such as: CT scan. MRI. An imaging test of how blood flows through your blood vessels (angiogram). How is this treated? Treating an underlying medical condition can sometimes make tinnitus go away. If your tinnitus continues, other treatments may include: Therapy and counseling to help you manage the stress of living with tinnitus. Sound generators to mask the tinnitus. These include: Tabletop sound machines that play relaxing sounds to help you fall asleep. Wearable devices that fit in your ear and play sounds or music. Acoustic neural stimulation. This involves using headphones to listen to music that contains an auditory signal. Over time, listening to this signal may change some pathways in your brain and make you less sensitive to tinnitus. This treatment is used for very severe cases when no other treatment is working. Using hearing aids or cochlear implants if your tinnitus is related to hearing loss. Hearing aids are worn in the outer ear. Cochlear implants are surgically placed in the inner ear. Follow these instructions at home: Managing symptoms   When possible, avoid being in loud places and being exposed to loud sounds. Wear hearing protection, such as earplugs, when you are exposed to loud noises. Use a white noise machine, a humidifier, or other devices to mask the sound of tinnitus. Practice techniques  for reducing stress, such as meditation, yoga, or deep breathing. Work with your health care provider if you need help with managing stress. Sleep with your head slightly  raised. This may reduce the impact of tinnitus. General instructions Do not use stimulants, such as nicotine, alcohol, or caffeine. Talk with your health care provider about other stimulants to avoid. Stimulants are substances that can make you feel alert and attentive by increasing certain activities in the body (such as heart rate and blood pressure). These substances may make tinnitus worse. Take over-the-counter and prescription medicines only as told by your health care provider. Try to get plenty of sleep each night. Keep all follow-up visits. This is important. Contact a health care provider if: Your tinnitus continues for 3 weeks or longer without stopping. You develop sudden hearing loss. Your symptoms get worse or do not get better with home care. You feel you are not able to manage the stress of living with tinnitus. Get help right away if: You develop tinnitus after a head injury. You have tinnitus along with any of the following: Dizziness. Nausea and vomiting. Loss of balance. Sudden, severe headache. Vision changes. Facial weakness or weakness of arms or legs. These symptoms may represent a serious problem that is an emergency. Do not wait to see if the symptoms will go away. Get medical help right away. Call your local emergency services (911 in the [place] Do not drive yourself to the hospital. Summary Tinnitus refers to hearing a sound when there is no actual source for that sound. This is often described as ringing in the ears. Symptoms may affect only one ear (unilateral) or both ears (bilateral). Use a white noise machine, a humidifier, or other devices to mask the sound of tinnitus. Do not use stimulants, such as nicotine, alcohol, or caffeine. These substances may make tinnitus worse. This information is not intended to replace advice given to you by your health care provider. Make sure you discuss any questions you have with your health care provider. Document  Revised: 11/05/2020 Document Reviewed: 11/05/2020 Elsevier Patient Education  2022 [employer].

## 2022-01-02 NOTE — Progress Notes (Signed)
Subjective:    Lori Livingston is a 66 y.o. female who presents for a Welcome to Medicare exam.   Review of Systems No complaints today        Objective:    Today's Vitals   01/02/22 0911  BP: 116/65  Pulse: 69  Temp: (!) 97.1 F (36.2 C)  TempSrc: Temporal  Weight: 196 lb (88.9 kg)  Height: 5\' 8"  (1.727 m)  Body mass index is 29.8 kg/m.  Medications Outpatient Encounter Medications as of 01/02/2022  Medication Sig   Cetirizine HCl (ZYRTEC ALLERGY PO) Take by mouth.   clobetasol cream (TEMOVATE) 3.97 % Apply 1 application topically 2 (two) times daily.   Coenzyme Q10 200 MG capsule Take 200 mg by mouth daily.   Ergocalciferol (VITAMIN D2) 2000 UNITS TABS Take 2,000 Int'l Units by mouth daily.   estradiol (ESTRACE) 0.5 MG tablet    meloxicam (MOBIC) 15 MG tablet TAKE 1 TABLET DAILY   Multiple Vitamin (MULTIVITAMIN) tablet Take 1 tablet by mouth daily.   pravastatin (PRAVACHOL) 40 MG tablet TABLET ONE TABLET AT BEDTIME   No facility-administered encounter medications on file as of 01/02/2022.     History: Past Medical History:  Diagnosis Date   Arthritis    Right knee   Hyperlipidemia    Neuromuscular disorder ([place])    Right knee meniscus tear   Vitamin D deficiency    Warts    Past Surgical History:  Procedure Laterality Date   APPENDECTOMY  02/1988   CHOLECYSTECTOMY  02/1988   SHOULDER SURGERY Right    TONSILLECTOMY  1964    Family History  Problem Relation Age of Onset   Heart disease Mother    Diabetes Mother    Heart attack Mother 18   Stroke Father    Cancer Brother        melanoma   Social History   Occupational History   Not on file  Tobacco Use   Smoking status: Former    Types: Cigarettes    Quit date: 07/06/1978    Years since quitting: 43.5   Smokeless tobacco: Never  Vaping Use   Vaping Use: Never used  Substance and Sexual Activity   Alcohol use: No   Drug use: No   Sexual activity: Not on file    Tobacco  Counseling Counseling given: Not Answered   Immunizations and Health Maintenance Immunization History  Administered Date(s) Administered   Influenza Split 10/04/2013   Influenza Whole 09/28/2012   Influenza, High Dose Seasonal PF 10/09/2021   Influenza,inj,Quad PF,6+ Mos 10/22/2019   Influenza,inj,Quad PF,6-35 Mos 10/22/2019   Influenza-Unspecified 10/05/2014, 09/25/2015, 09/23/2016, 09/17/2017, 09/09/2018, 09/24/2020   Moderna Sars-Covid-2 Vaccination 04/30/2020, 05/28/2020   Tdap 02/03/2017   Zoster, Live 09/28/2013   There are no preventive care reminders to display for this patient.  Activities of Daily Living No flowsheet data found.  Physical Exam  WNL, or other factors deemed appropriate based on the beneficiary's medical and social history and current clinical standards.  Advanced Directives: Paperwork given      Assessment:    This is a routine wellness examination for this patient .   Vision/Hearing screen No results found.  Dietary issues and exercise activities discussed:      Goals   None   Depression Screen PHQ 2/9 Scores 01/02/2022 11/04/2021 05/22/2021 05/06/2021  PHQ - 2 Score 0 0 0 0  PHQ- 9 Score 0 - - 0     Fall Risk Fall Risk  01/02/2022  Falls in the past year? 0    Cognitive Function:        Patient Care Team: Sharion Balloon, FNP as PCP - General (Nurse Practitioner) Lillette Boxer, MD as Referring Physician (Student)     Plan:   Continue medications and keep up the great work. Follow up in 3 months.   I have personally reviewed and noted the following in the patients chart:   Medical and social history Use of alcohol, tobacco or illicit drugs  Current medications and supplements Functional ability and status Nutritional status Physical activity Advanced directives List of other physicians Hospitalizations, surgeries, and ER visits in previous 12 months Vitals Screenings to include cognitive, depression, and  falls Referrals and appointments  In addition, I have reviewed and discussed with patient certain preventive protocols, quality metrics, and best practice recommendations. A written personalized care plan for preventive services as well as general preventive health recommendations were provided to patient.     Evelina Dun, [place] 01/02/2022

## 2022-01-24 DIAGNOSIS — M7712 Lateral epicondylitis, left elbow: Secondary | ICD-10-CM | POA: Diagnosis not present

## 2022-03-17 ENCOUNTER — Other Ambulatory Visit: Payer: Self-pay | Admitting: Family

## 2022-03-17 DIAGNOSIS — E785 Hyperlipidemia, unspecified: Secondary | ICD-10-CM

## 2022-04-14 ENCOUNTER — Other Ambulatory Visit: Payer: Self-pay | Admitting: Family

## 2022-04-14 DIAGNOSIS — M1711 Unilateral primary osteoarthritis, right knee: Secondary | ICD-10-CM

## 2022-04-14 NOTE — Telephone Encounter (Signed)
Patient last seen if Jan has appointment 05/22. Ok to refill? ?

## 2022-05-05 ENCOUNTER — Ambulatory Visit (INDEPENDENT_AMBULATORY_CARE_PROVIDER_SITE_OTHER): Payer: Medicare Other | Admitting: Family

## 2022-05-05 ENCOUNTER — Encounter: Payer: Self-pay | Admitting: Family

## 2022-05-05 VITALS — BP 112/65 | HR 68 | Temp 96.8°F | Resp 20 | Ht 68.0 in | Wt 194.0 lb

## 2022-05-05 DIAGNOSIS — N951 Menopausal and female climacteric states: Secondary | ICD-10-CM | POA: Diagnosis not present

## 2022-05-05 DIAGNOSIS — E119 Type 2 diabetes mellitus without complications: Secondary | ICD-10-CM | POA: Insufficient documentation

## 2022-05-05 DIAGNOSIS — Z0001 Encounter for general adult medical examination with abnormal findings: Secondary | ICD-10-CM

## 2022-05-05 DIAGNOSIS — Z0000 Encounter for general adult medical examination without abnormal findings: Secondary | ICD-10-CM | POA: Diagnosis not present

## 2022-05-05 DIAGNOSIS — M1711 Unilateral primary osteoarthritis, right knee: Secondary | ICD-10-CM | POA: Diagnosis not present

## 2022-05-05 DIAGNOSIS — E559 Vitamin D deficiency, unspecified: Secondary | ICD-10-CM | POA: Diagnosis not present

## 2022-05-05 DIAGNOSIS — R7303 Prediabetes: Secondary | ICD-10-CM | POA: Insufficient documentation

## 2022-05-05 DIAGNOSIS — E663 Overweight: Secondary | ICD-10-CM

## 2022-05-05 DIAGNOSIS — E785 Hyperlipidemia, unspecified: Secondary | ICD-10-CM | POA: Diagnosis not present

## 2022-05-05 DIAGNOSIS — Z79890 Hormone replacement therapy (postmenopausal): Secondary | ICD-10-CM

## 2022-05-05 LAB — BAYER DCA HB A1C WAIVED: HB A1C (BAYER DCA - WAIVED): 5.9 % — ABNORMAL HIGH (ref 4.8–5.6)

## 2022-05-05 NOTE — Patient Instructions (Signed)

## 2022-05-05 NOTE — Progress Notes (Signed)
Subjective:    Patient ID: Lori Livingston, female    DOB: 1956-08-13, 66 y.o.   MRN: [id number]  Chief Complaint  Patient presents with   Medical Management of Chronic Issues    6 mo    PT presents to the office today for CPE and chronic follow up. PT is followed by gynecologists every year. Pt is on Estrace after her hysterectomy in 2010 for hot flashes. Pt is followed by Ortho for bilateral knee pain. She had a right knee replaced in  2019.   She reports she retired May 2022.  Arthritis Presents for follow-up visit. She complains of pain and stiffness. The symptoms have been stable. Affected locations include the right knee. Her pain is at a severity of 4/10.  Diabetes She presents for her follow-up diabetic visit. Diabetes type: prediabetic. Pertinent negatives for diabetes include no blurred vision and no foot paresthesias. Symptoms are stable. Risk factors for coronary artery disease include dyslipidemia, diabetes mellitus and sedentary lifestyle. (Does not check BS at home) Eye exam is current.  Hyperlipidemia The current episode started more than 1 year ago. The problem is controlled. Current antihyperlipidemic treatment includes statins. The current treatment provides moderate improvement of lipids. Risk factors for coronary artery disease include dyslipidemia, diabetes mellitus, a sedentary lifestyle and post-menopausal.     Review of Systems  Eyes:  Negative for blurred vision.  Musculoskeletal:  Positive for arthritis and stiffness.  All other systems reviewed and are negative.  Family History  Problem Relation Age of Onset   Heart disease Mother    Diabetes Mother    Heart attack Mother 47   Stroke Father    Cancer Brother        melanoma   Social History   Socioeconomic History   Marital status: Married    Spouse name: Not on file   Number of children: Not on file   Years of education: Not on file   Highest education level: Not on file  Occupational History    Not on file  Tobacco Use   Smoking status: Former    Types: Cigarettes    Quit date: 07/06/1978    Years since quitting: 43.8   Smokeless tobacco: Never  Vaping Use   Vaping Use: Never used  Substance and Sexual Activity   Alcohol use: No   Drug use: No   Sexual activity: Not on file  Other Topics Concern   Not on file  Social History Narrative   Not on file   Social Determinants of Health   Financial Resource Strain: Not on file  Food Insecurity: Not on file  Transportation Needs: Not on file  Physical Activity: Not on file  Stress: Not on file  Social Connections: Not on file       Objective:   Physical Exam Vitals reviewed.  Constitutional:      General: She is not in acute distress.    Appearance: She is well-developed. She is obese.  HENT:     Head: Normocephalic and atraumatic.     Right Ear: Tympanic membrane normal.     Left Ear: Tympanic membrane normal.  Eyes:     Pupils: Pupils are equal, round, and reactive to light.  Neck:     Thyroid: No thyromegaly.  Cardiovascular:     Rate and Rhythm: Normal rate and regular rhythm.     Heart sounds: Normal heart sounds. No murmur heard. Pulmonary:     Effort: Pulmonary effort is normal.  No respiratory distress.     Breath sounds: Normal breath sounds. No wheezing.  Abdominal:     General: Bowel sounds are normal. There is no distension.     Palpations: Abdomen is soft.     Tenderness: There is no abdominal tenderness.  Musculoskeletal:        General: No tenderness. Normal range of motion.     Cervical back: Normal range of motion and neck supple.  Skin:    General: Skin is warm and dry.  Neurological:     Mental Status: She is alert and oriented to person, place, and time.     Cranial Nerves: No cranial nerve deficit.     Deep Tendon Reflexes: Reflexes are normal and symmetric.  Psychiatric:        Behavior: Behavior normal.        Thought Content: Thought content normal.        Judgment: Judgment  normal.      BP 112/65   Pulse 68   Temp (!) 96.8 F (36 C)   Resp 20   Ht 5' 8"  (1.727 m)   Wt 194 lb (88 kg)   SpO2 96%   BMI 29.50 kg/m      Assessment & Plan:  Lori Livingston comes in today with chief complaint of Medical Management of Chronic Issues (6 mo )   Diagnosis and orders addressed:  1. Hyperlipidemia, unspecified hyperlipidemia type - CMP14+EGFR - CBC with Differential/Platelet - Lipid panel  2. Menopausal syndrome on hormone replacement therapy - CMP14+EGFR - CBC with Differential/Platelet  3. Overweight (BMI 25.0-29.9) - CMP14+EGFR - CBC with Differential/Platelet  4. Vitamin D deficiency - CMP14+EGFR - CBC with Differential/Platelet - VITAMIN D 25 Hydroxy (Vit-D Deficiency, Fractures)  5. Arthritis of knee, right - CMP14+EGFR - CBC with Differential/Platelet  6. Prediabetes - Bayer DCA Hb A1c Waived - CMP14+EGFR - CBC with Differential/Platelet  7. Annual physical exam - Bayer DCA Hb A1c Waived - CMP14+EGFR - CBC with Differential/Platelet - Lipid panel - TSH - VITAMIN D 25 Hydroxy (Vit-D Deficiency, Fractures)   Labs pending Health Maintenance reviewed Diet and exercise encouraged  Follow up plan: 6 months    Evelina Dun, FNP

## 2022-05-06 LAB — CBC WITH DIFFERENTIAL/PLATELET
Basophils Absolute: 0.1 10*3/uL (ref 0.0–0.2)
Basos: 1 %
EOS (ABSOLUTE): 0.3 10*3/uL (ref 0.0–0.4)
Eos: 4 %
Hematocrit: 44.7 % (ref 34.0–46.6)
Hemoglobin: 15.1 g/dL (ref 11.1–15.9)
Immature Grans (Abs): 0 10*3/uL (ref 0.0–0.1)
Immature Granulocytes: 0 %
Lymphocytes Absolute: 3.7 10*3/uL — ABNORMAL HIGH (ref 0.7–3.1)
Lymphs: 41 %
MCH: 32.4 pg (ref 26.6–33.0)
MCHC: 33.8 g/dL (ref 31.5–35.7)
MCV: 96 fL (ref 79–97)
Monocytes Absolute: 0.7 10*3/uL (ref 0.1–0.9)
Monocytes: 8 %
Neutrophils Absolute: 4.1 10*3/uL (ref 1.4–7.0)
Neutrophils: 46 %
Platelets: 339 10*3/uL (ref 150–450)
RBC: 4.66 x10E6/uL (ref 3.77–5.28)
RDW: 12.6 % (ref 11.7–15.4)
WBC: 8.9 10*3/uL (ref 3.4–10.8)

## 2022-05-06 LAB — CMP14+EGFR
ALT: 16 IU/L (ref 0–32)
AST: 17 IU/L (ref 0–40)
Albumin/Globulin Ratio: 1.8 (ref 1.2–2.2)
Albumin: 4.6 g/dL (ref 3.8–4.8)
Alkaline Phosphatase: 66 IU/L (ref 44–121)
BUN/Creatinine Ratio: 27 (ref 12–28)
BUN: 17 mg/dL (ref 8–27)
Bilirubin Total: 0.8 mg/dL (ref 0.0–1.2)
CO2: 21 mmol/L (ref 20–29)
Calcium: 9.6 mg/dL (ref 8.7–10.3)
Chloride: 101 mmol/L (ref 96–106)
Creatinine, Ser: 0.62 mg/dL (ref 0.57–1.00)
Globulin, Total: 2.6 g/dL (ref 1.5–4.5)
Glucose: 131 mg/dL — ABNORMAL HIGH (ref 70–99)
Potassium: 4 mmol/L (ref 3.5–5.2)
Sodium: 139 mmol/L (ref 134–144)
Total Protein: 7.2 g/dL (ref 6.0–8.5)
eGFR: 98 mL/min/{1.73_m2} (ref >59)

## 2022-05-06 LAB — LIPID PANEL
Chol/HDL Ratio: 4.1 ratio (ref 0.0–4.4)
Cholesterol, Total: 170 mg/dL (ref 100–199)
HDL: 41 mg/dL (ref >39)
LDL Chol Calc (NIH): 97 mg/dL (ref 0–99)
Triglycerides: 185 mg/dL — ABNORMAL HIGH (ref 0–149)
VLDL Cholesterol Cal: 32 mg/dL (ref 5–40)

## 2022-05-06 LAB — VITAMIN D 25 HYDROXY (VIT D DEFICIENCY, FRACTURES): Vit D, 25-Hydroxy: 34 ng/mL (ref 30.0–100.0)

## 2022-05-06 LAB — TSH: TSH: 3.14 u[IU]/mL (ref 0.450–4.500)

## 2022-05-27 DIAGNOSIS — H40013 Open angle with borderline findings, low risk, bilateral: Secondary | ICD-10-CM | POA: Diagnosis not present

## 2022-05-27 DIAGNOSIS — H2513 Age-related nuclear cataract, bilateral: Secondary | ICD-10-CM | POA: Diagnosis not present

## 2022-06-03 ENCOUNTER — Ambulatory Visit (INDEPENDENT_AMBULATORY_CARE_PROVIDER_SITE_OTHER): Payer: Medicare Other | Admitting: Physician Assistant

## 2022-06-03 ENCOUNTER — Encounter: Payer: Self-pay | Admitting: Physician Assistant

## 2022-06-03 VITALS — BP 123/77 | HR 71 | Temp 97.5°F | Ht 68.0 in | Wt 193.0 lb

## 2022-06-03 DIAGNOSIS — J069 Acute upper respiratory infection, unspecified: Secondary | ICD-10-CM | POA: Diagnosis not present

## 2022-06-03 MED ORDER — AMOXICILLIN 875 MG PO TABS: 875.0000 mg | ORAL_TABLET | Freq: Two times a day (BID) | ORAL | 0 refills | Status: AC

## 2022-06-03 NOTE — Patient Instructions (Signed)

## 2022-06-03 NOTE — Progress Notes (Signed)
  Subjective:     Patient ID: Lori Livingston, female   DOB: 04/26/1956, 66 y.o.   MRN: [id number]  Cough Associated symptoms include ear pain and postnasal drip. Pertinent negatives include no shortness of breath.   Cough, congestion, ear pain x 5 days Sx are worsening Using OTC meds for sx  Review of Systems  Constitutional:  Positive for fatigue.  HENT:  Positive for congestion, ear pain and postnasal drip. Negative for sinus pressure and sinus pain.   Respiratory:  Positive for cough. Negative for shortness of breath.   Cardiovascular: Negative.        Objective:   Physical Exam Vitals and nursing note reviewed.  Constitutional:      General: She is not in acute distress.    Appearance: Normal appearance. She is not ill-appearing or toxic-appearing.  HENT:     Right Ear: Ear canal and external ear normal. Tympanic membrane is injected and bulging.     Left Ear: Tympanic membrane and ear canal normal.     Mouth/Throat:     Mouth: Mucous membranes are moist.     Pharynx: Oropharynx is clear.  Cardiovascular:     Rate and Rhythm: Normal rate and regular rhythm.     Heart sounds: Normal heart sounds.  Pulmonary:     Effort: Pulmonary effort is normal.     Breath sounds: Normal breath sounds.  Neurological:     Mental Status: She is alert.        Assessment:     1. Upper respiratory tract infection, unspecified type        Plan:     Fluids Rest OTC meds for sx Work note Amox rx F/u prn PATP

## 2022-06-05 ENCOUNTER — Telehealth: Payer: Self-pay | Admitting: Family

## 2022-06-05 DIAGNOSIS — R112 Nausea with vomiting, unspecified: Secondary | ICD-10-CM

## 2022-06-05 MED ORDER — ONDANSETRON HCL 4 MG PO TABS: 4.0000 mg | ORAL_TABLET | Freq: Three times a day (TID) | ORAL | 0 refills | Status: DC | PRN

## 2022-06-05 NOTE — Telephone Encounter (Signed)
Seen 06/20 was given Amoxicillin and it is making her throw up. Taking med's on a full stomach and she is requesting to have something else called in.   Please advise

## 2022-06-05 NOTE — Telephone Encounter (Signed)
Pt informed and understood. Will call back if needed.

## 2022-06-05 NOTE — Telephone Encounter (Signed)
I sent in some Zofran for her, have her take that 1 hour before she takes the amoxicillin.

## 2022-06-16 ENCOUNTER — Other Ambulatory Visit: Payer: Self-pay | Admitting: Family

## 2022-06-16 DIAGNOSIS — E785 Hyperlipidemia, unspecified: Secondary | ICD-10-CM

## 2022-07-11 DIAGNOSIS — Z1231 Encounter for screening mammogram for malignant neoplasm of breast: Secondary | ICD-10-CM | POA: Diagnosis not present

## 2022-07-11 LAB — HM MAMMOGRAPHY

## 2022-07-16 DIAGNOSIS — Z23 Encounter for immunization: Secondary | ICD-10-CM | POA: Diagnosis not present

## 2022-07-16 DIAGNOSIS — E785 Hyperlipidemia, unspecified: Secondary | ICD-10-CM | POA: Diagnosis not present

## 2022-07-16 DIAGNOSIS — M1990 Unspecified osteoarthritis, unspecified site: Secondary | ICD-10-CM | POA: Diagnosis not present

## 2022-07-16 DIAGNOSIS — N6321 Unspecified lump in the left breast, upper outer quadrant: Secondary | ICD-10-CM | POA: Diagnosis not present

## 2022-07-16 DIAGNOSIS — N61 Mastitis without abscess: Secondary | ICD-10-CM | POA: Diagnosis not present

## 2022-07-29 DIAGNOSIS — N6325 Unspecified lump in the left breast, overlapping quadrants: Secondary | ICD-10-CM | POA: Diagnosis not present

## 2022-08-01 DIAGNOSIS — N6489 Other specified disorders of breast: Secondary | ICD-10-CM | POA: Diagnosis not present

## 2022-08-01 DIAGNOSIS — N644 Mastodynia: Secondary | ICD-10-CM | POA: Diagnosis not present

## 2022-08-01 DIAGNOSIS — R922 Inconclusive mammogram: Secondary | ICD-10-CM | POA: Diagnosis not present

## 2022-08-11 DIAGNOSIS — C44319 Basal cell carcinoma of skin of other parts of face: Secondary | ICD-10-CM | POA: Diagnosis not present

## 2022-08-11 DIAGNOSIS — D492 Neoplasm of unspecified behavior of bone, soft tissue, and skin: Secondary | ICD-10-CM | POA: Diagnosis not present

## 2022-08-11 DIAGNOSIS — D2260 Melanocytic nevi of unspecified upper limb, including shoulder: Secondary | ICD-10-CM | POA: Diagnosis not present

## 2022-08-11 DIAGNOSIS — L821 Other seborrheic keratosis: Secondary | ICD-10-CM | POA: Diagnosis not present

## 2022-08-11 DIAGNOSIS — D1801 Hemangioma of skin and subcutaneous tissue: Secondary | ICD-10-CM | POA: Diagnosis not present

## 2022-08-11 DIAGNOSIS — L409 Psoriasis, unspecified: Secondary | ICD-10-CM | POA: Diagnosis not present

## 2022-08-11 DIAGNOSIS — D2270 Melanocytic nevi of unspecified lower limb, including hip: Secondary | ICD-10-CM | POA: Diagnosis not present

## 2022-08-11 DIAGNOSIS — L820 Inflamed seborrheic keratosis: Secondary | ICD-10-CM | POA: Diagnosis not present

## 2022-08-11 DIAGNOSIS — D225 Melanocytic nevi of trunk: Secondary | ICD-10-CM | POA: Diagnosis not present

## 2022-08-21 DIAGNOSIS — N611 Abscess of the breast and nipple: Secondary | ICD-10-CM | POA: Diagnosis not present

## 2022-09-05 DIAGNOSIS — N644 Mastodynia: Secondary | ICD-10-CM | POA: Diagnosis not present

## 2022-09-05 DIAGNOSIS — N6012 Diffuse cystic mastopathy of left breast: Secondary | ICD-10-CM | POA: Diagnosis not present

## 2022-09-05 DIAGNOSIS — N6325 Unspecified lump in the left breast, overlapping quadrants: Secondary | ICD-10-CM | POA: Diagnosis not present

## 2022-09-05 DIAGNOSIS — Z803 Family history of malignant neoplasm of breast: Secondary | ICD-10-CM | POA: Diagnosis not present

## 2022-09-12 DIAGNOSIS — N6012 Diffuse cystic mastopathy of left breast: Secondary | ICD-10-CM | POA: Diagnosis not present

## 2022-10-20 DIAGNOSIS — C44319 Basal cell carcinoma of skin of other parts of face: Secondary | ICD-10-CM | POA: Diagnosis not present

## 2022-10-20 DIAGNOSIS — L905 Scar conditions and fibrosis of skin: Secondary | ICD-10-CM | POA: Diagnosis not present

## 2022-11-10 ENCOUNTER — Encounter: Payer: Self-pay | Admitting: Family

## 2022-11-10 ENCOUNTER — Ambulatory Visit (INDEPENDENT_AMBULATORY_CARE_PROVIDER_SITE_OTHER): Payer: Medicare Other | Admitting: Family

## 2022-11-10 VITALS — BP 131/84 | HR 67 | Temp 98.1°F | Ht 68.0 in | Wt 190.0 lb

## 2022-11-10 DIAGNOSIS — E663 Overweight: Secondary | ICD-10-CM | POA: Diagnosis not present

## 2022-11-10 DIAGNOSIS — Z79890 Hormone replacement therapy (postmenopausal): Secondary | ICD-10-CM

## 2022-11-10 DIAGNOSIS — M1711 Unilateral primary osteoarthritis, right knee: Secondary | ICD-10-CM

## 2022-11-10 DIAGNOSIS — E785 Hyperlipidemia, unspecified: Secondary | ICD-10-CM

## 2022-11-10 DIAGNOSIS — R7303 Prediabetes: Secondary | ICD-10-CM

## 2022-11-10 DIAGNOSIS — Z23 Encounter for immunization: Secondary | ICD-10-CM | POA: Diagnosis not present

## 2022-11-10 DIAGNOSIS — E559 Vitamin D deficiency, unspecified: Secondary | ICD-10-CM | POA: Diagnosis not present

## 2022-11-10 DIAGNOSIS — N951 Menopausal and female climacteric states: Secondary | ICD-10-CM

## 2022-11-10 LAB — BAYER DCA HB A1C WAIVED: HB A1C (BAYER DCA - WAIVED): 6 % — ABNORMAL HIGH (ref 4.8–5.6)

## 2022-11-10 MED ORDER — MELOXICAM 15 MG PO TABS: 15.0000 mg | ORAL_TABLET | Freq: Every day | ORAL | 2 refills | Status: DC

## 2022-11-10 NOTE — Progress Notes (Signed)
Subjective:    Patient ID: Lori Livingston, female    DOB: Jun 30, 1956, 66 y.o.   MRN: [id number]  Chief Complaint  Patient presents with   Medical Management of Chronic Issues   PT presents to the office today for chronic follow up. PT is followed by gynecologists every year. Pt is on Estrace after her hysterectomy in 2010 for hot flashes. Pt is followed by Ortho for bilateral knee pain. She had a right knee replaced in  2019.   She reports she retired May 2022. She started a part time job.   Arthritis Presents for follow-up visit. She complains of pain and stiffness. The symptoms have been stable. Affected locations include the right knee. Her pain is at a severity of 6/10.  Hyperlipidemia This is a chronic problem. The current episode started more than 1 year ago. The problem is controlled. Recent lipid tests were reviewed and are normal. Current antihyperlipidemic treatment includes statins. The current treatment provides moderate improvement of lipids. Risk factors for coronary artery disease include dyslipidemia, hypertension, a sedentary lifestyle and post-menopausal.  Diabetes She presents for her follow-up diabetic visit. Diabetes type: prediabetic. Pertinent negatives for diabetes include no blurred vision and no foot paresthesias. Symptoms are stable. Risk factors for coronary artery disease include dyslipidemia and sedentary lifestyle.      Review of Systems  Eyes:  Negative for blurred vision.  Musculoskeletal:  Positive for arthritis and stiffness.  All other systems reviewed and are negative.      Objective:   Physical Exam Vitals reviewed.  Constitutional:      General: She is not in acute distress.    Appearance: She is well-developed.  HENT:     Head: Normocephalic and atraumatic.     Right Ear: Tympanic membrane normal.     Left Ear: Tympanic membrane normal.  Eyes:     Pupils: Pupils are equal, round, and reactive to light.  Neck:     Thyroid: No  thyromegaly.  Cardiovascular:     Rate and Rhythm: Normal rate and regular rhythm.     Heart sounds: Normal heart sounds. No murmur heard. Pulmonary:     Effort: Pulmonary effort is normal. No respiratory distress.     Breath sounds: Normal breath sounds. No wheezing.  Abdominal:     General: Bowel sounds are normal. There is no distension.     Palpations: Abdomen is soft.     Tenderness: There is no abdominal tenderness.  Musculoskeletal:        General: No tenderness. Normal range of motion.     Cervical back: Normal range of motion and neck supple.     Right lower leg: Edema (trace) present.     Left lower leg: Edema (trace) present.  Skin:    General: Skin is warm and dry.  Neurological:     Mental Status: She is alert and oriented to person, place, and time.     Cranial Nerves: No cranial nerve deficit.     Deep Tendon Reflexes: Reflexes are normal and symmetric.  Psychiatric:        Behavior: Behavior normal.        Thought Content: Thought content normal.        Judgment: Judgment normal.       BP 131/84   Pulse 67   Temp 98.1 F (36.7 C) (Temporal)   Ht _0  (1.727 m)   Wt 190 lb (86.2 kg)   SpO2 95%   BMI 28.89  kg/m      Assessment & Plan:  JAZZALYNN RHUDY comes in today with chief complaint of Medical Management of Chronic Issues   Diagnosis and orders addressed:  1. Arthritis of knee, right  - meloxicam (MOBIC) 15 MG tablet; Take 1 tablet (15 mg total) by mouth daily.  Dispense: 90 tablet; Refill: 2 - CMP14+EGFR - CBC with Differential/Platelet  2. Need for immunization against influenza - Flu Vaccine QUAD High Dose(Fluad) - CMP14+EGFR - CBC with Differential/Platelet  3. Hyperlipidemia, unspecified hyperlipidemia type - CMP14+EGFR - CBC with Differential/Platelet  4. Overweight (BMI 25.0-29.9) - CMP14+EGFR - CBC with Differential/Platelet  5. Prediabetes - CMP14+EGFR - CBC with Differential/Platelet - Bayer DCA Hb A1c Waived  6.  Vitamin D deficiency - CMP14+EGFR - CBC with Differential/Platelet  7. Menopausal syndrome on hormone replacement therapy - CMP14+EGFR - CBC with Differential/Platelet   Labs pending Health Maintenance reviewed Diet and exercise encouraged  Follow up plan: 6 months   Evelina Dun, FNP

## 2022-11-10 NOTE — Patient Instructions (Signed)
Health Maintenance After Age 66 After age 65, you are at a higher risk for certain long-term diseases and infections as well as injuries from falls. Falls are a major cause of broken bones and head injuries in people who are older than age 66. Getting regular preventive care can help to keep you healthy and well. Preventive care includes getting regular testing and making lifestyle changes as recommended by your health care provider. Talk with your health care provider about: Which screenings and tests you should have. A screening is a test that checks for a disease when you have no symptoms. A diet and exercise plan that is right for you. What should I know about screenings and tests to prevent falls? Screening and testing are the best ways to find a health problem early. Early diagnosis and treatment give you the best chance of managing medical conditions that are common after age 66. Certain conditions and lifestyle choices may make you more likely to have a fall. Your health care provider may recommend: Regular vision checks. Poor vision and conditions such as cataracts can make you more likely to have a fall. If you wear glasses, make sure to get your prescription updated if your vision changes. Medicine review. Work with your health care provider to regularly review all of the medicines you are taking, including over-the-counter medicines. Ask your health care provider about any side effects that may make you more likely to have a fall. Tell your health care provider if any medicines that you take make you feel dizzy or sleepy. Strength and balance checks. Your health care provider may recommend certain tests to check your strength and balance while standing, walking, or changing positions. Foot health exam. Foot pain and numbness, as well as not wearing proper footwear, can make you more likely to have a fall. Screenings, including: Osteoporosis screening. Osteoporosis is a condition that causes  the bones to get weaker and break more easily. Blood pressure screening. Blood pressure changes and medicines to control blood pressure can make you feel dizzy. Depression screening. You may be more likely to have a fall if you have a fear of falling, feel depressed, or feel unable to do activities that you used to do. Alcohol use screening. Using too much alcohol can affect your balance and may make you more likely to have a fall. Follow these instructions at home: Lifestyle Do not drink alcohol if: Your health care provider tells you not to drink. If you drink alcohol: Limit how much you have to: 0-1 drink a day for women. 0-2 drinks a day for men. Know how much alcohol is in your drink. In the [place] one drink equals one 12 oz bottle of beer (355 mL), one 5 oz glass of wine (148 mL), or one 1 oz glass of hard liquor (44 mL). Do not use any products that contain nicotine or tobacco. These products include cigarettes, chewing tobacco, and vaping devices, such as e-cigarettes. If you need help quitting, ask your health care provider. Activity  Follow a regular exercise program to stay fit. This will help you maintain your balance. Ask your health care provider what types of exercise are appropriate for you. If you need a cane or walker, use it as recommended by your health care provider. Wear supportive shoes that have nonskid soles. Safety  Remove any tripping hazards, such as rugs, cords, and clutter. Install safety equipment such as grab bars in bathrooms and safety rails on stairs. Keep rooms and walkways   well-lit. General instructions Talk with your health care provider about your risks for falling. Tell your health care provider if: You fall. Be sure to tell your health care provider about all falls, even ones that seem minor. You feel dizzy, tiredness (fatigue), or off-balance. Take over-the-counter and prescription medicines only as told by your health care provider. These include  supplements. Eat a healthy diet and maintain a healthy weight. A healthy diet includes low-fat dairy products, low-fat (lean) meats, and fiber from whole grains, beans, and lots of fruits and vegetables. Stay current with your vaccines. Schedule regular health, dental, and eye exams. Summary Having a healthy lifestyle and getting preventive care can help to protect your health and wellness after age 66. Screening and testing are the best way to find a health problem early and help you avoid having a fall. Early diagnosis and treatment give you the best chance for managing medical conditions that are more common for people who are older than age 66. Falls are a major cause of broken bones and head injuries in people who are older than age 66. Take precautions to prevent a fall at home. Work with your health care provider to learn what changes you can make to improve your health and wellness and to prevent falls. This information is not intended to replace advice given to you by your health care provider. Make sure you discuss any questions you have with your health care provider. Document Revised: 04/22/2021 Document Reviewed: 04/22/2021 Elsevier Patient Education  2023 [employer] Inc.  

## 2022-11-11 LAB — CMP14+EGFR
ALT: 15 IU/L (ref 0–32)
AST: 16 IU/L (ref 0–40)
Albumin/Globulin Ratio: 1.8 (ref 1.2–2.2)
Albumin: 4.6 g/dL (ref 3.9–4.9)
Alkaline Phosphatase: 67 IU/L (ref 44–121)
BUN/Creatinine Ratio: 27 (ref 12–28)
BUN: 17 mg/dL (ref 8–27)
Bilirubin Total: 0.7 mg/dL (ref 0.0–1.2)
CO2: 21 mmol/L (ref 20–29)
Calcium: 9.9 mg/dL (ref 8.7–10.3)
Chloride: 106 mmol/L (ref 96–106)
Creatinine, Ser: 0.64 mg/dL (ref 0.57–1.00)
Globulin, Total: 2.5 g/dL (ref 1.5–4.5)
Glucose: 115 mg/dL — ABNORMAL HIGH (ref 70–99)
Potassium: 4.4 mmol/L (ref 3.5–5.2)
Sodium: 146 mmol/L — ABNORMAL HIGH (ref 134–144)
Total Protein: 7.1 g/dL (ref 6.0–8.5)
eGFR: 97 mL/min/{1.73_m2} (ref >59)

## 2022-11-11 LAB — CBC WITH DIFFERENTIAL/PLATELET
Basophils Absolute: 0.1 10*3/uL (ref 0.0–0.2)
Basos: 1 %
EOS (ABSOLUTE): 0.3 10*3/uL (ref 0.0–0.4)
Eos: 3 %
Hematocrit: 43.9 % (ref 34.0–46.6)
Hemoglobin: 15 g/dL (ref 11.1–15.9)
Immature Grans (Abs): 0 10*3/uL (ref 0.0–0.1)
Immature Granulocytes: 0 %
Lymphocytes Absolute: 3.5 10*3/uL — ABNORMAL HIGH (ref 0.7–3.1)
Lymphs: 42 %
MCH: 32.1 pg (ref 26.6–33.0)
MCHC: 34.2 g/dL (ref 31.5–35.7)
MCV: 94 fL (ref 79–97)
Monocytes Absolute: 0.8 10*3/uL (ref 0.1–0.9)
Monocytes: 9 %
Neutrophils Absolute: 3.7 10*3/uL (ref 1.4–7.0)
Neutrophils: 45 %
Platelets: 341 10*3/uL (ref 150–450)
RBC: 4.68 x10E6/uL (ref 3.77–5.28)
RDW: 12.5 % (ref 11.7–15.4)
WBC: 8.2 10*3/uL (ref 3.4–10.8)

## 2022-12-05 DIAGNOSIS — R92322 Mammographic fibroglandular density, left breast: Secondary | ICD-10-CM | POA: Diagnosis not present

## 2022-12-05 DIAGNOSIS — R922 Inconclusive mammogram: Secondary | ICD-10-CM | POA: Diagnosis not present

## 2022-12-05 DIAGNOSIS — R92332 Mammographic heterogeneous density, left breast: Secondary | ICD-10-CM | POA: Diagnosis not present

## 2022-12-05 DIAGNOSIS — R928 Other abnormal and inconclusive findings on diagnostic imaging of breast: Secondary | ICD-10-CM | POA: Diagnosis not present

## 2022-12-19 ENCOUNTER — Encounter: Payer: Self-pay | Admitting: Family

## 2022-12-20 ENCOUNTER — Other Ambulatory Visit: Payer: Self-pay | Admitting: Family

## 2022-12-20 DIAGNOSIS — E785 Hyperlipidemia, unspecified: Secondary | ICD-10-CM

## 2022-12-29 ENCOUNTER — Encounter: Payer: Self-pay | Admitting: Nurse Practitioner

## 2022-12-29 ENCOUNTER — Ambulatory Visit (INDEPENDENT_AMBULATORY_CARE_PROVIDER_SITE_OTHER): Payer: Medicare Other | Admitting: Nurse Practitioner

## 2022-12-29 VITALS — BP 124/74 | HR 71 | Temp 97.2°F | Resp 20 | Ht 68.0 in | Wt 193.0 lb

## 2022-12-29 DIAGNOSIS — J0100 Acute maxillary sinusitis, unspecified: Secondary | ICD-10-CM

## 2022-12-29 MED ORDER — FLUTICASONE PROPIONATE 50 MCG/ACT NA SUSP: 2.0000 | Freq: Every day | NASAL | 6 refills | Status: DC

## 2022-12-29 MED ORDER — AMOXICILLIN-POT CLAVULANATE 875-125 MG PO TABS: 1.0000 | ORAL_TABLET | Freq: Two times a day (BID) | ORAL | 0 refills | Status: DC

## 2022-12-29 NOTE — Progress Notes (Signed)
Subjective:    Patient ID: Lori Livingston, female    DOB: 12/13/1956, 67 y.o.   MRN: [id number]  Chief Complaint: Sinus Problem (Been sick sick right after Christmas/)   Sinus Problem This is a new problem. The current episode started 1 to 4 weeks ago. The problem has been waxing and waning since onset. There has been no fever. Her pain is at a severity of 0/10. She is experiencing no pain. Associated symptoms include congestion, coughing and sinus pressure. Pertinent negatives include no chills, headaches, shortness of breath or sore throat. Past treatments include acetaminophen, nasal decongestants and oral decongestants. The treatment provided mild relief.       Review of Systems  Constitutional:  Negative for chills and fever.  HENT:  Positive for congestion, postnasal drip, rhinorrhea and sinus pressure. Negative for sore throat.   Respiratory:  Positive for cough. Negative for shortness of breath and wheezing.   Neurological:  Negative for dizziness and headaches.       Objective:   Physical Exam Vitals reviewed.  Constitutional:      Appearance: Normal appearance.  HENT:     Right Ear: Tympanic membrane normal.     Left Ear: Tympanic membrane normal.     Nose: Congestion and rhinorrhea present.     Right Sinus: Maxillary sinus tenderness present.     Left Sinus: Maxillary sinus tenderness present.     Mouth/Throat:     Mouth: Mucous membranes are moist.     Pharynx: No oropharyngeal exudate or posterior oropharyngeal erythema.  Eyes:     Pupils: Pupils are equal, round, and reactive to light.  Cardiovascular:     Rate and Rhythm: Regular rhythm.     Heart sounds: Normal heart sounds.  Pulmonary:     Effort: Pulmonary effort is normal.     Breath sounds: Normal breath sounds.  Musculoskeletal:     Cervical back: Normal range of motion and neck supple.  Skin:    General: Skin is warm.  Neurological:     General: No focal deficit present.     Mental Status:  She is alert and oriented to person, place, and time.  Psychiatric:        Mood and Affect: Mood normal.        Behavior: Behavior normal.     BP 124/74   Pulse 71   Temp (!) 97.2 F (36.2 C) (Temporal)   Resp 20   Ht '5\' 8"'$  (1.727 m)   Wt 193 lb (87.5 kg)   SpO2 96%   BMI 29.35 kg/m        Assessment & Plan:   Lori Livingston in today with chief complaint of Sinus Problem (Been sick sick right after Christmas/)   1. Acute non-recurrent maxillary sinusitis 1. Take meds as prescribed 2. Use a cool mist humidifier especially during the winter months and when heat has been humid. 3. Use saline nose sprays frequently 4. Saline irrigations of the nose can be very helpful if done frequently.  * 4X daily for 1 week*  * Use of a nettie pot can be helpful with this. Follow directions with this* 5. Drink plenty of fluids 6. Keep thermostat turn down low 7.For any cough or congestion- robitussin DM 8. For fever or aces or pains- take tylenol or ibuprofen appropriate for age and weight.  * for fevers greater than 101 orally you may alternate ibuprofen and tylenol every  3 hours.    Meds ordered  this encounter  Medications   fluticasone (FLONASE) 50 MCG/ACT nasal spray    Sig: Place 2 sprays into both nostrils daily.    Dispense:  16 g    Refill:  6    Order Specific Question:   Supervising Provider    Answer:   Caryl Pina A [[id number]]   amoxicillin-clavulanate (AUGMENTIN) 875-125 MG tablet    Sig: Take 1 tablet by mouth 2 (two) times daily.    Dispense:  14 tablet    Refill:  0    Order Specific Question:   Supervising Provider    Answer:   Caryl Pina A [[id number]]      The above assessment and management plan was discussed with the patient. The patient verbalized understanding of and has agreed to the management plan. Patient is aware to call the clinic if symptoms persist or worsen. Patient is aware when to return to the clinic for a follow-up visit. Patient  educated on when it is appropriate to go to the emergency department.   Mary-Margaret Hassell Done, FNP

## 2022-12-29 NOTE — Patient Instructions (Signed)
1. Take meds as prescribed 2. Use a cool mist humidifier especially during the winter months and when heat has been humid. 3. Use saline nose sprays frequently 4. Saline irrigations of the nose can be very helpful if done frequently.  * 4X daily for 1 week*  * Use of a nettie pot can be helpful with this. Follow directions with this* 5. Drink plenty of fluids 6. Keep thermostat turn down low 7.For any cough or congestion- robitussin DM 8. For fever or aces or pains- take tylenol or ibuprofen appropriate for age and weight.  * for fevers greater than 101 orally you may alternate ibuprofen and tylenol every  3 hours.

## 2023-01-05 ENCOUNTER — Ambulatory Visit: Payer: Medicare Other | Admitting: Family

## 2023-03-13 DIAGNOSIS — K219 Gastro-esophageal reflux disease without esophagitis: Secondary | ICD-10-CM | POA: Diagnosis not present

## 2023-03-18 ENCOUNTER — Other Ambulatory Visit: Payer: Self-pay | Admitting: Family

## 2023-03-18 DIAGNOSIS — E785 Hyperlipidemia, unspecified: Secondary | ICD-10-CM

## 2023-05-05 DIAGNOSIS — M7989 Other specified soft tissue disorders: Secondary | ICD-10-CM | POA: Diagnosis not present

## 2023-05-05 DIAGNOSIS — S91332A Puncture wound without foreign body, left foot, initial encounter: Secondary | ICD-10-CM | POA: Diagnosis not present

## 2023-05-05 DIAGNOSIS — S9782XA Crushing injury of left foot, initial encounter: Secondary | ICD-10-CM | POA: Diagnosis not present

## 2023-05-05 DIAGNOSIS — S9032XA Contusion of left foot, initial encounter: Secondary | ICD-10-CM | POA: Diagnosis not present

## 2023-05-15 ENCOUNTER — Encounter: Payer: Self-pay | Admitting: Family

## 2023-05-15 ENCOUNTER — Ambulatory Visit (INDEPENDENT_AMBULATORY_CARE_PROVIDER_SITE_OTHER): Payer: Medicare Other | Admitting: Family

## 2023-05-15 VITALS — BP 122/80 | HR 66 | Temp 97.6°F | Ht 67.0 in | Wt 197.2 lb

## 2023-05-15 DIAGNOSIS — M1711 Unilateral primary osteoarthritis, right knee: Secondary | ICD-10-CM | POA: Diagnosis not present

## 2023-05-15 DIAGNOSIS — R7303 Prediabetes: Secondary | ICD-10-CM | POA: Diagnosis not present

## 2023-05-15 DIAGNOSIS — Z79890 Hormone replacement therapy (postmenopausal): Secondary | ICD-10-CM

## 2023-05-15 DIAGNOSIS — K219 Gastro-esophageal reflux disease without esophagitis: Secondary | ICD-10-CM | POA: Diagnosis not present

## 2023-05-15 DIAGNOSIS — E785 Hyperlipidemia, unspecified: Secondary | ICD-10-CM | POA: Diagnosis not present

## 2023-05-15 DIAGNOSIS — Z0000 Encounter for general adult medical examination without abnormal findings: Secondary | ICD-10-CM | POA: Diagnosis not present

## 2023-05-15 DIAGNOSIS — N951 Menopausal and female climacteric states: Secondary | ICD-10-CM

## 2023-05-15 DIAGNOSIS — Z0001 Encounter for general adult medical examination with abnormal findings: Secondary | ICD-10-CM

## 2023-05-15 DIAGNOSIS — E559 Vitamin D deficiency, unspecified: Secondary | ICD-10-CM

## 2023-05-15 DIAGNOSIS — E669 Obesity, unspecified: Secondary | ICD-10-CM

## 2023-05-15 LAB — BAYER DCA HB A1C WAIVED: HB A1C (BAYER DCA - WAIVED): 6.3 % — ABNORMAL HIGH (ref 4.8–5.6)

## 2023-05-15 MED ORDER — PRAVASTATIN SODIUM 40 MG PO TABS: ORAL_TABLET | ORAL | 1 refills | Status: DC

## 2023-05-15 MED ORDER — MELOXICAM 15 MG PO TABS
15.0000 mg | ORAL_TABLET | Freq: Every day | ORAL | 2 refills | Status: DC
Start: 2023-05-15 — End: 2023-11-23

## 2023-05-15 NOTE — Patient Instructions (Signed)
Health Maintenance After Age 67 After age 67, you are at a higher risk for certain long-term diseases and infections as well as injuries from falls. Falls are a major cause of broken bones and head injuries in people who are older than age 67. Getting regular preventive care can help to keep you healthy and well. Preventive care includes getting regular testing and making lifestyle changes as recommended by your health care provider. Talk with your health care provider about: Which screenings and tests you should have. A screening is a test that checks for a disease when you have no symptoms. A diet and exercise plan that is right for you. What should I know about screenings and tests to prevent falls? Screening and testing are the best ways to find a health problem early. Early diagnosis and treatment give you the best chance of managing medical conditions that are common after age 67. Certain conditions and lifestyle choices may make you more likely to have a fall. Your health care provider may recommend: Regular vision checks. Poor vision and conditions such as cataracts can make you more likely to have a fall. If you wear glasses, make sure to get your prescription updated if your vision changes. Medicine review. Work with your health care provider to regularly review all of the medicines you are taking, including over-the-counter medicines. Ask your health care provider about any side effects that may make you more likely to have a fall. Tell your health care provider if any medicines that you take make you feel dizzy or sleepy. Strength and balance checks. Your health care provider may recommend certain tests to check your strength and balance while standing, walking, or changing positions. Foot health exam. Foot pain and numbness, as well as not wearing proper footwear, can make you more likely to have a fall. Screenings, including: Osteoporosis screening. Osteoporosis is a condition that causes  the bones to get weaker and break more easily. Blood pressure screening. Blood pressure changes and medicines to control blood pressure can make you feel dizzy. Depression screening. You may be more likely to have a fall if you have a fear of falling, feel depressed, or feel unable to do activities that you used to do. Alcohol use screening. Using too much alcohol can affect your balance and may make you more likely to have a fall. Follow these instructions at home: Lifestyle Do not drink alcohol if: Your health care provider tells you not to drink. If you drink alcohol: Limit how much you have to: 0-1 drink a day for women. 0-2 drinks a day for men. Know how much alcohol is in your drink. In the [place] one drink equals one 12 oz bottle of beer (355 mL), one 5 oz glass of wine (148 mL), or one 1 oz glass of hard liquor (44 mL). Do not use any products that contain nicotine or tobacco. These products include cigarettes, chewing tobacco, and vaping devices, such as e-cigarettes. If you need help quitting, ask your health care provider. Activity  Follow a regular exercise program to stay fit. This will help you maintain your balance. Ask your health care provider what types of exercise are appropriate for you. If you need a cane or walker, use it as recommended by your health care provider. Wear supportive shoes that have nonskid soles. Safety  Remove any tripping hazards, such as rugs, cords, and clutter. Install safety equipment such as grab bars in bathrooms and safety rails on stairs. Keep rooms and walkways   well-lit. General instructions Talk with your health care provider about your risks for falling. Tell your health care provider if: You fall. Be sure to tell your health care provider about all falls, even ones that seem minor. You feel dizzy, tiredness (fatigue), or off-balance. Take over-the-counter and prescription medicines only as told by your health care provider. These include  supplements. Eat a healthy diet and maintain a healthy weight. A healthy diet includes low-fat dairy products, low-fat (lean) meats, and fiber from whole grains, beans, and lots of fruits and vegetables. Stay current with your vaccines. Schedule regular health, dental, and eye exams. Summary Having a healthy lifestyle and getting preventive care can help to protect your health and wellness after age 67. Screening and testing are the best way to find a health problem early and help you avoid having a fall. Early diagnosis and treatment give you the best chance for managing medical conditions that are more common for people who are older than age 67. Falls are a major cause of broken bones and head injuries in people who are older than age 67. Take precautions to prevent a fall at home. Work with your health care provider to learn what changes you can make to improve your health and wellness and to prevent falls. This information is not intended to replace advice given to you by your health care provider. Make sure you discuss any questions you have with your health care provider. Document Revised: 04/22/2021 Document Reviewed: 04/22/2021 Elsevier Patient Education  2024 [employer]  

## 2023-05-15 NOTE — Progress Notes (Signed)
Subjective:    Patient ID: Lori Livingston, female    DOB: Jul 21, 1956, 67 y.o.   MRN: [id number]  Chief Complaint  Patient presents with   Medical Management of Chronic Issues    No concerns. Patient is fasting   PT presents to the office today for CPE and chronic follow up. PT is followed by gynecologists every year. Pt is on Estrace after her hysterectomy in 2010 for hot flashes.   Pt is followed by Ortho for bilateral knee pain as needed. She had a right knee replaced in  2019.   She reports she retired May 2022.  Arthritis Presents for follow-up visit. She complains of pain and stiffness. Affected locations include the left knee, right knee, right shoulder, left shoulder, left foot and right foot. Her pain is at a severity of 4/10.  Diabetes She presents for her follow-up diabetic visit. Diabetes type: prediabetic. Pertinent negatives for diabetes include no blurred vision and no foot paresthesias. Risk factors for coronary artery disease include dyslipidemia, diabetes mellitus, hypertension and sedentary lifestyle. She is following a generally healthy diet. Her overall blood glucose range is 130-140 mg/dl.  Hyperlipidemia This is a chronic problem. The current episode started more than 1 year ago. The problem is controlled. Recent lipid tests were reviewed and are normal. Exacerbating diseases include obesity. Current antihyperlipidemic treatment includes statins. The current treatment provides moderate improvement of lipids. Risk factors for coronary artery disease include dyslipidemia, hypertension and a sedentary lifestyle.  Gastroesophageal Reflux She complains of belching and heartburn. This is a chronic problem. The current episode started more than 1 year ago. The problem occurs occasionally. She has tried a PPI for the symptoms. The treatment provided moderate relief.      Review of Systems  Eyes:  Negative for blurred vision.  Gastrointestinal:  Positive for heartburn.   Musculoskeletal:  Positive for arthritis and stiffness.  All other systems reviewed and are negative.  Family History  Problem Relation Age of Onset   Heart disease Mother    Diabetes Mother    Heart attack Mother 83   Stroke Father    Cancer Brother        melanoma   Social History   Socioeconomic History   Marital status: Married    Spouse name: Not on file   Number of children: Not on file   Years of education: Not on file   Highest education level: Not on file  Occupational History   Not on file  Tobacco Use   Smoking status: Former    Types: Cigarettes    Quit date: 07/06/1978    Years since quitting: 44.8   Smokeless tobacco: Never  Vaping Use   Vaping Use: Never used  Substance and Sexual Activity   Alcohol use: No   Drug use: No   Sexual activity: Not on file  Other Topics Concern   Not on file  Social History Narrative   Not on file   Social Determinants of Health   Financial Resource Strain: Not on file  Food Insecurity: Not on file  Transportation Needs: Not on file  Physical Activity: Not on file  Stress: Not on file  Social Connections: Not on file       Objective:   Physical Exam Vitals reviewed.  Constitutional:      General: She is not in acute distress.    Appearance: She is well-developed. She is obese.  HENT:     Head: Normocephalic and atraumatic.  Right Ear: Tympanic membrane normal.     Left Ear: Tympanic membrane normal.  Eyes:     Pupils: Pupils are equal, round, and reactive to light.  Neck:     Thyroid: No thyromegaly.  Cardiovascular:     Rate and Rhythm: Normal rate and regular rhythm.     Heart sounds: Normal heart sounds. No murmur heard. Pulmonary:     Effort: Pulmonary effort is normal. No respiratory distress.     Breath sounds: Normal breath sounds. No wheezing.  Abdominal:     General: Bowel sounds are normal. There is no distension.     Palpations: Abdomen is soft.     Tenderness: There is no abdominal  tenderness.  Musculoskeletal:        General: No tenderness. Normal range of motion.     Cervical back: Normal range of motion and neck supple.  Skin:    General: Skin is warm and dry.  Neurological:     Mental Status: She is alert and oriented to person, place, and time.     Cranial Nerves: No cranial nerve deficit.     Deep Tendon Reflexes: Reflexes are normal and symmetric.  Psychiatric:        Behavior: Behavior normal.        Thought Content: Thought content normal.        Judgment: Judgment normal.      BP 122/80   Pulse 66   Temp 97.6 F (36.4 C) (Temporal)   Ht 5\' 7"  (1.702 m)   Wt 197 lb 3.2 oz (89.4 kg)   SpO2 95%   BMI 30.89 kg/m      Assessment & Plan:  Lori Livingston comes in today with chief complaint of Medical Management of Chronic Issues (No concerns. Patient is fasting)   Diagnosis and orders addressed:  1. Arthritis of knee, right - CBC with Differential/Platelet - CMP14+EGFR - meloxicam (MOBIC) 15 MG tablet; Take 1 tablet (15 mg total) by mouth daily.  Dispense: 90 tablet; Refill: 2  2. Hyperlipidemia, unspecified hyperlipidemia type - CBC with Differential/Platelet - CMP14+EGFR - Lipid panel - pravastatin (PRAVACHOL) 40 MG tablet; TABLET ONE TABLET AT BEDTIME  Dispense: 90 tablet; Refill: 1  3. Gastroesophageal reflux disease without esophagitis - CBC with Differential/Platelet - CMP14+EGFR  4. Annual physical exam - Bayer DCA Hb A1c Waived - CBC with Differential/Platelet - CMP14+EGFR - Lipid panel - TSH - VITAMIN D 25 Hydroxy (Vit-D Deficiency, Fractures)  5. Menopausal syndrome on hormone replacement therapy - CBC with Differential/Platelet - CMP14+EGFR  6. Obesity (BMI 30-39.9) - CBC with Differential/Platelet - CMP14+EGFR  7. Prediabetes - Bayer DCA Hb A1c Waived - CBC with Differential/Platelet - CMP14+EGFR  8. Vitamin D deficiency  - CBC with Differential/Platelet - CMP14+EGFR - VITAMIN D 25 Hydroxy (Vit-D  Deficiency, Fractures)   Labs pending Health Maintenance reviewed Diet and exercise encouraged  Follow up plan: 6 months    Jannifer Rodney, FNP

## 2023-05-16 LAB — CMP14+EGFR
ALT: 22 IU/L (ref 0–32)
AST: 18 IU/L (ref 0–40)
Albumin/Globulin Ratio: 1.7 (ref 1.2–2.2)
Albumin: 4.5 g/dL (ref 3.9–4.9)
Alkaline Phosphatase: 85 IU/L (ref 44–121)
BUN/Creatinine Ratio: 25 (ref 12–28)
BUN: 15 mg/dL (ref 8–27)
Bilirubin Total: 1 mg/dL (ref 0.0–1.2)
CO2: 23 mmol/L (ref 20–29)
Calcium: 9.8 mg/dL (ref 8.7–10.3)
Chloride: 103 mmol/L (ref 96–106)
Creatinine, Ser: 0.59 mg/dL (ref 0.57–1.00)
Globulin, Total: 2.6 g/dL (ref 1.5–4.5)
Glucose: 140 mg/dL — ABNORMAL HIGH (ref 70–99)
Potassium: 4 mmol/L (ref 3.5–5.2)
Sodium: 141 mmol/L (ref 134–144)
Total Protein: 7.1 g/dL (ref 6.0–8.5)
eGFR: 99 mL/min/{1.73_m2} (ref >59)

## 2023-05-16 LAB — CBC WITH DIFFERENTIAL/PLATELET
Basophils Absolute: 0.1 10*3/uL (ref 0.0–0.2)
Basos: 1 %
EOS (ABSOLUTE): 0.4 10*3/uL (ref 0.0–0.4)
Eos: 5 %
Hematocrit: 41.2 % (ref 34.0–46.6)
Hemoglobin: 14.1 g/dL (ref 11.1–15.9)
Immature Grans (Abs): 0 10*3/uL (ref 0.0–0.1)
Immature Granulocytes: 0 %
Lymphocytes Absolute: 3.3 10*3/uL — ABNORMAL HIGH (ref 0.7–3.1)
Lymphs: 41 %
MCH: 31.1 pg (ref 26.6–33.0)
MCHC: 34.2 g/dL (ref 31.5–35.7)
MCV: 91 fL (ref 79–97)
Monocytes Absolute: 0.7 10*3/uL (ref 0.1–0.9)
Monocytes: 9 %
Neutrophils Absolute: 3.6 10*3/uL (ref 1.4–7.0)
Neutrophils: 44 %
Platelets: 333 10*3/uL (ref 150–450)
RBC: 4.53 x10E6/uL (ref 3.77–5.28)
RDW: 12.3 % (ref 11.7–15.4)
WBC: 8.1 10*3/uL (ref 3.4–10.8)

## 2023-05-16 LAB — LIPID PANEL
Chol/HDL Ratio: 3.7 ratio (ref 0.0–4.4)
Cholesterol, Total: 155 mg/dL (ref 100–199)
HDL: 42 mg/dL (ref >39)
LDL Chol Calc (NIH): 79 mg/dL (ref 0–99)
Triglycerides: 204 mg/dL — ABNORMAL HIGH (ref 0–149)
VLDL Cholesterol Cal: 34 mg/dL (ref 5–40)

## 2023-05-16 LAB — VITAMIN D 25 HYDROXY (VIT D DEFICIENCY, FRACTURES): Vit D, 25-Hydroxy: 45 ng/mL (ref 30.0–100.0)

## 2023-05-16 LAB — TSH: TSH: 3.29 u[IU]/mL (ref 0.450–4.500)

## 2023-06-08 DIAGNOSIS — R928 Other abnormal and inconclusive findings on diagnostic imaging of breast: Secondary | ICD-10-CM | POA: Diagnosis not present

## 2023-06-08 DIAGNOSIS — R92333 Mammographic heterogeneous density, bilateral breasts: Secondary | ICD-10-CM | POA: Diagnosis not present

## 2023-06-08 LAB — HM MAMMOGRAPHY

## 2023-07-14 DIAGNOSIS — H2513 Age-related nuclear cataract, bilateral: Secondary | ICD-10-CM | POA: Diagnosis not present

## 2023-07-14 DIAGNOSIS — H40013 Open angle with borderline findings, low risk, bilateral: Secondary | ICD-10-CM | POA: Diagnosis not present

## 2023-07-15 DIAGNOSIS — Z780 Asymptomatic menopausal state: Secondary | ICD-10-CM | POA: Diagnosis not present

## 2023-07-15 DIAGNOSIS — Z9189 Other specified personal risk factors, not elsewhere classified: Secondary | ICD-10-CM | POA: Diagnosis not present

## 2023-07-15 DIAGNOSIS — Z13820 Encounter for screening for osteoporosis: Secondary | ICD-10-CM | POA: Diagnosis not present

## 2023-07-15 LAB — HM DEXA SCAN: HM Dexa Scan: NORMAL

## 2023-08-04 ENCOUNTER — Ambulatory Visit: Payer: Medicare Other

## 2023-08-07 ENCOUNTER — Ambulatory Visit: Payer: Medicare Other

## 2023-08-07 VITALS — Ht 67.0 in | Wt 190.0 lb

## 2023-08-07 DIAGNOSIS — Z0000 Encounter for general adult medical examination without abnormal findings: Secondary | ICD-10-CM | POA: Diagnosis not present

## 2023-08-07 NOTE — Progress Notes (Signed)
Subjective:   Lori Livingston is a 67 y.o. female who presents for an Initial Medicare Annual Wellness Visit.  Visit Complete: Virtual  I connected with  Lori Livingston on 08/07/23 by a audio enabled telemedicine application and verified that I am speaking with the correct person using two identifiers.  Patient Location: Home  Provider Location: Home Office  I discussed the limitations of evaluation and management by telemedicine. The patient expressed understanding and agreed to proceed.  Patient Medicare AWV questionnaire was completed by the patient on 08/07/2023; I have confirmed that all information answered by patient is correct and no changes since this date.  Review of Systems    Vital Signs: Unable to obtain new vitals due to this being a telehealth visit.        Objective:    Today's Vitals   08/07/23 0804  Weight: 190 lb (86.2 kg)  Height: 5\' 7"  (1.702 m)   Body mass index is 29.76 kg/m.     08/07/2023    8:08 AM  Advanced Directives  Does Patient Have a Medical Advance Directive? No  Would patient like information on creating a medical advance directive? Yes (MAU/Ambulatory/Procedural Areas - Information given)    Current Medications (verified) Outpatient Encounter Medications as of 08/07/2023  Medication Sig   Cetirizine HCl (ZYRTEC ALLERGY PO) Take by mouth.   clobetasol cream (TEMOVATE) 0.05 % Apply 1 application topically 2 (two) times daily.   Coenzyme Q10 200 MG capsule Take 200 mg by mouth daily.   Ergocalciferol (VITAMIN D2) 2000 UNITS TABS Take 2,000 Int'l Units by mouth daily.   estradiol (ESTRACE) 0.5 MG tablet    fluticasone (FLONASE) 50 MCG/ACT nasal spray Place 2 sprays into both nostrils daily.   meloxicam (MOBIC) 15 MG tablet Take 1 tablet (15 mg total) by mouth daily.   Multiple Vitamin (MULTIVITAMIN) tablet Take 1 tablet by mouth daily.   omeprazole (PRILOSEC OTC) 20 MG tablet Take 20 mg by mouth daily.   pravastatin (PRAVACHOL) 40 MG  tablet TABLET ONE TABLET AT BEDTIME   No facility-administered encounter medications on file as of 08/07/2023.    Allergies (verified) Sulfamethoxazole-trimethoprim, Celebrex [celecoxib], Indomethacin, Propoxyphene n-acetaminophen, Nickel, and Propoxyphene   History: Past Medical History:  Diagnosis Date   Arthritis    Right knee   Hyperlipidemia    Neuromuscular disorder (HCC)    Right knee meniscus tear   Vitamin D deficiency    Warts    Past Surgical History:  Procedure Laterality Date   APPENDECTOMY  02/1988   CHOLECYSTECTOMY  02/1988   SHOULDER SURGERY Right    TONSILLECTOMY  1964   Family History  Problem Relation Age of Onset   Heart disease Mother    Diabetes Mother    Heart attack Mother 53   Stroke Father    Cancer Brother        melanoma   Social History   Socioeconomic History   Marital status: Married    Spouse name: Not on file   Number of children: Not on file   Years of education: Not on file   Highest education level: Not on file  Occupational History   Not on file  Tobacco Use   Smoking status: Former    Current packs/day: 0.00    Types: Cigarettes    Quit date: 07/06/1978    Years since quitting: 45.1   Smokeless tobacco: Never  Vaping Use   Vaping status: Never Used  Substance and Sexual Activity  Alcohol use: No   Drug use: No   Sexual activity: Not on file  Other Topics Concern   Not on file  Social History Narrative   Not on file   Social Determinants of Health   Financial Resource Strain: Low Risk  (08/07/2023)   Overall Financial Resource Strain (CARDIA)    Difficulty of Paying Living Expenses: Not hard at all  Food Insecurity: No Food Insecurity (08/07/2023)   Hunger Vital Sign    Worried About Running Out of Food in the Last Year: Never true    Ran Out of Food in the Last Year: Never true  Transportation Needs: No Transportation Needs (08/07/2023)   PRAPARE - Administrator, Civil Service (Medical): No     Lack of Transportation (Non-Medical): No  Physical Activity: Insufficiently Active (08/07/2023)   Exercise Vital Sign    Days of Exercise per Week: 2 days    Minutes of Exercise per Session: 20 min  Stress: No Stress Concern Present (08/07/2023)   [employer] of Occupational Health - Occupational Stress Questionnaire    Feeling of Stress : Not at all  Social Connections: Socially Integrated (08/07/2023)   Social Connection and Isolation Panel [NHANES]    Frequency of Communication with Friends and Family: More than three times a week    Frequency of Social Gatherings with Friends and Family: More than three times a week    Attends Religious Services: More than 4 times per year    Active Member of [employer] or Organizations: Yes    Attends Engineer, structural: More than 4 times per year    Marital Status: Married    Tobacco Counseling Counseling given: Not Answered   Clinical Intake:  Pre-visit preparation completed: Yes  Pain : No/denies pain     Nutritional Risks: None Diabetes: No  How often do you need to have someone help you when you read instructions, pamphlets, or other written materials from your doctor or pharmacy?: 1 - Never  Interpreter Needed?: No  Information entered by :: Lori Ora, LPN   Activities of Daily Living    08/05/2023    2:17 PM  In your present state of health, do you have any difficulty performing the following activities:  Hearing? 0  Vision? 0  Difficulty concentrating or making decisions? 0  Walking or climbing stairs? 0  Dressing or bathing? 0  Doing errands, shopping? 0  Preparing Food and eating ? N  Using the Toilet? N  In the past six months, have you accidently leaked urine? N  Do you have problems with loss of bowel control? N  Managing your Medications? N  Managing your Finances? N  Housekeeping or managing your Housekeeping? N    Patient Care Team: Junie Spencer, FNP as PCP - General (Nurse  Practitioner) Loreta Ave, MD as Referring Physician (Student)  Indicate any recent Medical Services you may have received from other than [facility] providers in the past year (date may be approximate).     Assessment:   This is a routine wellness examination for Lori Livingston.  Hearing/Vision screen Vision Screening - Comments:: Wears rx glasses - up to date with routine eye exams with  Dr.Nineighbor   Dietary issues and exercise activities discussed:     Goals Addressed             This Visit's Progress    Exercise 3x per week (30 min per time)  Depression Screen    08/07/2023    8:07 AM 05/15/2023    8:02 AM 12/29/2022   12:06 PM 11/10/2022    8:08 AM 06/03/2022    3:18 PM 05/05/2022    8:16 AM 01/02/2022    9:08 AM  PHQ 2/9 Scores  PHQ - 2 Score 0 0 0 0 0 0 0  PHQ- 9 Score  0 0    0    Fall Risk    08/07/2023    8:05 AM 08/05/2023    2:17 PM 05/15/2023    8:02 AM 12/29/2022   12:06 PM 11/10/2022    8:08 AM  Fall Risk   Falls in the past year? 0 0 0 0 0  Number falls in past yr: 0 0 0    Injury with Fall? 0  0    Risk for fall due to : No Fall Risks  No Fall Risks    Follow up Falls prevention discussed  Education provided;Falls evaluation completed      MEDICARE RISK AT HOME: Medicare Risk at Home Any stairs in or around the home?: Yes If so, are there any without handrails?: No Home free of loose throw rugs in walkways, pet beds, electrical cords, etc?: Yes Adequate lighting in your home to reduce risk of falls?: Yes Life alert?: No Use of a cane, walker or w/c?: No Grab bars in the bathroom?: Yes Shower chair or bench in shower?: Yes Elevated toilet seat or a handicapped toilet?: Yes  TIMED UP AND GO:  Was the test performed? No    Cognitive Function:        08/07/2023    8:08 AM  6CIT Screen  What Year? 0 points  What month? 0 points  What time? 0 points  Count back from 20 0 points  Months in reverse 0 points  Repeat phrase 0 points   Total Score 0 points    Immunizations Immunization History  Administered Date(s) Administered   Fluad Quad(high Dose 65+) 10/09/2021, 11/10/2022   Influenza Split 09/24/2010, 10/04/2013   Influenza Whole 09/28/2012   Influenza, High Dose Seasonal PF 10/09/2021   Influenza,inj,Quad PF,6+ Mos 10/22/2019   Influenza,inj,Quad PF,6-35 Mos 10/22/2019   Influenza-Unspecified 10/04/2013, 10/05/2014, 09/25/2015, 09/23/2016, 09/17/2017, 09/09/2018, 10/22/2019, 09/24/2020   Moderna Sars-Covid-2 Vaccination 04/30/2020, 05/28/2020   Tdap 02/03/2017, 07/16/2022   Zoster, Live 07/26/2013, 09/28/2013    TDAP status: Up to date  Flu Vaccine status: Up to date  Pneumococcal vaccine status: Due, Education has been provided regarding the importance of this vaccine. Advised may receive this vaccine at local pharmacy or Health Dept. Aware to provide a copy of the vaccination record if obtained from local pharmacy or Health Dept. Verbalized acceptance and understanding.  Covid-19 vaccine status: Completed vaccines  Qualifies for Shingles Vaccine? Yes   Zostavax completed No   Shingrix Completed?: No.    Education has been provided regarding the importance of this vaccine. Patient has been advised to call insurance company to determine out of pocket expense if they have not yet received this vaccine. Advised may also receive vaccine at local pharmacy or Health Dept. Verbalized acceptance and understanding.  Screening Tests Health Maintenance  Topic Date Due   COVID-19 Vaccine (3 - Moderna risk series) 06/25/2020   INFLUENZA VACCINE  07/16/2023   Zoster Vaccines- Shingrix (1 of 2) 08/15/2023 (Originally 03/09/1975)   Pneumonia Vaccine 25+ Years old (1 of 1 - PCV) 11/11/2023 (Originally 03/08/2021)   MAMMOGRAM  06/07/2024   Medicare  Annual Wellness (AWV)  08/06/2024   Colonoscopy  08/05/2026   DTaP/Tdap/Td (3 - Td or Tdap) 07/16/2032   DEXA SCAN  Completed   Hepatitis C Screening  Completed   HPV  VACCINES  Aged Out    Health Maintenance  Health Maintenance Due  Topic Date Due   COVID-19 Vaccine (3 - Moderna risk series) 06/25/2020   INFLUENZA VACCINE  07/16/2023    Colorectal cancer screening: Type of screening: Colonoscopy. Completed 08/05/2016. Repeat every 10 years  Mammogram status: Completed 06/08/2023. Repeat every year  Bone Density status: Completed 07/15/2023. Results reflect: Bone density results: OSTEOPOROSIS. Repeat every 2 years.  Lung Cancer Screening: (Low Dose CT Chest recommended if Age 65-80 years, 20 pack-year currently smoking OR have quit w/in 15years.) does not qualify.   Lung Cancer Screening Referral: n/a  Additional Screening:  Hepatitis C Screening: does not qualify; Completed 01/28/2018  Vision Screening: Recommended annual ophthalmology exams for early detection of glaucoma and other disorders of the eye. Is the patient up to date with their annual eye exam?  Yes  Who is the provider or what is the name of the office in which the patient attends annual eye exams? Dr.Neighbors  If pt is not established with a provider, would they like to be referred to a provider to establish care? No .   Dental Screening: Recommended annual dental exams for proper oral hygiene    Community Resource Referral / Chronic Care Management: CRR required this visit?  No   CCM required this visit?  No     Plan:     I have personally reviewed and noted the following in the patient's chart:   Medical and social history Use of alcohol, tobacco or illicit drugs  Current medications and supplements including opioid prescriptions. Patient is not currently taking opioid prescriptions. Functional ability and status Nutritional status Physical activity Advanced directives List of other physicians Hospitalizations, surgeries, and ER visits in previous 12 months Vitals Screenings to include cognitive, depression, and falls Referrals and appointments  In  addition, I have reviewed and discussed with patient certain preventive protocols, quality metrics, and best practice recommendations. A written personalized care plan for preventive services as well as general preventive health recommendations were provided to patient.     Lori Reid, LPN   03/23/8118   After Visit Summary: (MyChart) Due to this being a telephonic visit, the after visit summary with patients personalized plan was offered to patient via MyChart   Nurse Notes: none

## 2023-08-07 NOTE — Patient Instructions (Signed)
Ms. Pollan , Thank you for taking time to come for your Medicare Wellness Visit. I appreciate your ongoing commitment to your health goals. Please review the following plan we discussed and let me know if I can assist you in the future.   Referrals/Orders/Follow-Ups/Clinician Recommendations: Aim for 30 minutes of exercise or brisk walking, 6-8 glasses of water, and 5 servings of fruits and vegetables each day.   This is a list of the screening recommended for you and due dates:  Health Maintenance  Topic Date Due   COVID-19 Vaccine (3 - Moderna risk series) 06/25/2020   Flu Shot  07/16/2023   Zoster (Shingles) Vaccine (1 of 2) 08/15/2023*   Pneumonia Vaccine (1 of 1 - PCV) 11/11/2023*   Mammogram  06/07/2024   Medicare Annual Wellness Visit  08/06/2024   Colon Cancer Screening  08/05/2026   DTaP/Tdap/Td vaccine (3 - Td or Tdap) 07/16/2032   DEXA scan (bone density measurement)  Completed   Hepatitis C Screening  Completed   HPV Vaccine  Aged Out  *Topic was postponed. The date shown is not the original due date.    Advanced directives: (Provided) Advance directive discussed with you today. I have provided a copy for you to complete at home and have notarized. Once this is complete, please bring a copy in to our office so we can scan it into your chart. Information on Advanced Care Planning can be found at [facility] Advance Health Care Directives Advance Health Care Directives ([email or web address]    Next Medicare Annual Wellness Visit scheduled for next year: Yes  Insert Preventive Care attachment Insert FALL PREVENTION attachment if needed

## 2023-08-18 ENCOUNTER — Encounter: Payer: Self-pay | Admitting: *Deleted

## 2023-08-25 DIAGNOSIS — L821 Other seborrheic keratosis: Secondary | ICD-10-CM | POA: Diagnosis not present

## 2023-08-25 DIAGNOSIS — D229 Melanocytic nevi, unspecified: Secondary | ICD-10-CM | POA: Diagnosis not present

## 2023-08-25 DIAGNOSIS — L570 Actinic keratosis: Secondary | ICD-10-CM | POA: Diagnosis not present

## 2023-08-25 DIAGNOSIS — Z85828 Personal history of other malignant neoplasm of skin: Secondary | ICD-10-CM | POA: Diagnosis not present

## 2023-08-25 DIAGNOSIS — L409 Psoriasis, unspecified: Secondary | ICD-10-CM | POA: Diagnosis not present

## 2023-08-25 DIAGNOSIS — L820 Inflamed seborrheic keratosis: Secondary | ICD-10-CM | POA: Diagnosis not present

## 2023-09-18 ENCOUNTER — Ambulatory Visit (INDEPENDENT_AMBULATORY_CARE_PROVIDER_SITE_OTHER): Payer: Medicare Other | Admitting: Family Medicine

## 2023-09-18 ENCOUNTER — Encounter: Payer: Self-pay | Admitting: Family Medicine

## 2023-09-18 VITALS — BP 138/74 | HR 67 | Temp 98.6°F | Wt 194.6 lb

## 2023-09-18 DIAGNOSIS — H6692 Otitis media, unspecified, left ear: Secondary | ICD-10-CM

## 2023-09-18 MED ORDER — AMOXICILLIN 875 MG PO TABS
875.0000 mg | ORAL_TABLET | Freq: Two times a day (BID) | ORAL | 0 refills | Status: AC
Start: 2023-09-18 — End: 2023-09-28

## 2023-09-18 NOTE — Progress Notes (Addendum)
   Acute Office Visit  Subjective:     Patient ID: Lori Livingston, female    DOB: 01/29/1956, 67 y.o.   MRN: [id number]  Chief Complaint  Patient presents with   Otalgia    Otalgia  There is pain in the left ear. This is a new problem. The current episode started in the past 7 days. The problem occurs constantly. The problem has been gradually worsening. There has been no fever. The pain is moderate. Pertinent negatives include no abdominal pain, coughing, diarrhea, ear discharge, headaches, hearing loss, neck pain, rash, rhinorrhea, sore throat or vomiting. Associated symptoms comments: No drainage. . She has tried acetaminophen for the symptoms. The treatment provided mild relief. There is no history of a chronic ear infection, hearing loss or a tympanostomy tube.    Review of Systems  HENT:  Positive for ear pain. Negative for ear discharge, hearing loss, rhinorrhea and sore throat.   Respiratory:  Negative for cough.   Gastrointestinal:  Negative for abdominal pain, diarrhea and vomiting.  Musculoskeletal:  Negative for neck pain.  Skin:  Negative for rash.  Neurological:  Negative for headaches.        Objective:    BP 138/74   Pulse 67   Temp 98.6 F (37 C)   Wt 194 lb 9.6 oz (88.3 kg)   SpO2 94%   BMI 30.48 kg/m    Physical Exam Vitals and nursing note reviewed.  Constitutional:      General: She is not in acute distress.    Appearance: Normal appearance. She is not ill-appearing, toxic-appearing or diaphoretic.  HENT:     Head: Normocephalic and atraumatic.     Right Ear: Ear canal and external ear normal. A middle ear effusion is present.     Left Ear: Ear canal and external ear normal. A middle ear effusion is present. Tympanic membrane is erythematous. Tympanic membrane is not retracted or bulging.     Nose: Nose normal.     Mouth/Throat:     Mouth: Mucous membranes are moist.     Pharynx: Oropharynx is clear.  Eyes:     General:        Right eye: No  discharge.        Left eye: No discharge.     Conjunctiva/sclera: Conjunctivae normal.  Pulmonary:     Effort: Pulmonary effort is normal. No respiratory distress.  Musculoskeletal:     Cervical back: Neck supple. No rigidity.  Lymphadenopathy:     Cervical: No cervical adenopathy.  Skin:    General: Skin is warm and dry.  Neurological:     General: No focal deficit present.     Mental Status: She is alert and oriented to person, place, and time.  Psychiatric:        Mood and Affect: Mood normal.        Behavior: Behavior normal.     No results found for any visits on 09/18/23.      Assessment & Plan:   Lori Livingston was seen today for otalgia.  Diagnoses and all orders for this visit:  Acute otitis media, left Discussed symptomatic care and return precautions.  -     amoxicillin (AMOXIL) 875 MG tablet; Take 1 tablet (875 mg total) by mouth 2 (two) times daily for 10 days.  The patient indicates understanding of these issues and agrees with the plan.  Gabriel Earing, FNP

## 2023-09-22 DIAGNOSIS — W228XXA Striking against or struck by other objects, initial encounter: Secondary | ICD-10-CM | POA: Diagnosis not present

## 2023-09-22 DIAGNOSIS — L601 Onycholysis: Secondary | ICD-10-CM | POA: Diagnosis not present

## 2023-09-22 DIAGNOSIS — Y92511 Restaurant or cafe as the place of occurrence of the external cause: Secondary | ICD-10-CM | POA: Diagnosis not present

## 2023-09-22 DIAGNOSIS — Y999 Unspecified external cause status: Secondary | ICD-10-CM | POA: Diagnosis not present

## 2023-09-22 DIAGNOSIS — R262 Difficulty in walking, not elsewhere classified: Secondary | ICD-10-CM | POA: Diagnosis not present

## 2023-09-22 DIAGNOSIS — Y939 Activity, unspecified: Secondary | ICD-10-CM | POA: Diagnosis not present

## 2023-09-22 DIAGNOSIS — S91201A Unspecified open wound of right great toe with damage to nail, initial encounter: Secondary | ICD-10-CM | POA: Diagnosis not present

## 2023-09-22 DIAGNOSIS — S99921A Unspecified injury of right foot, initial encounter: Secondary | ICD-10-CM | POA: Diagnosis not present

## 2023-09-22 DIAGNOSIS — M79674 Pain in right toe(s): Secondary | ICD-10-CM | POA: Diagnosis not present

## 2023-10-06 DIAGNOSIS — L03031 Cellulitis of right toe: Secondary | ICD-10-CM | POA: Diagnosis not present

## 2023-11-20 ENCOUNTER — Ambulatory Visit: Payer: Medicare Other | Admitting: Family

## 2023-11-23 ENCOUNTER — Encounter: Payer: Self-pay | Admitting: Family

## 2023-11-23 ENCOUNTER — Ambulatory Visit (INDEPENDENT_AMBULATORY_CARE_PROVIDER_SITE_OTHER): Payer: Medicare Other | Admitting: Family

## 2023-11-23 VITALS — BP 133/79 | HR 67 | Temp 97.1°F | Ht 67.0 in | Wt 196.0 lb

## 2023-11-23 DIAGNOSIS — E669 Obesity, unspecified: Secondary | ICD-10-CM

## 2023-11-23 DIAGNOSIS — R7303 Prediabetes: Secondary | ICD-10-CM

## 2023-11-23 DIAGNOSIS — Z79890 Hormone replacement therapy (postmenopausal): Secondary | ICD-10-CM

## 2023-11-23 DIAGNOSIS — E559 Vitamin D deficiency, unspecified: Secondary | ICD-10-CM | POA: Diagnosis not present

## 2023-11-23 DIAGNOSIS — E785 Hyperlipidemia, unspecified: Secondary | ICD-10-CM

## 2023-11-23 DIAGNOSIS — M1711 Unilateral primary osteoarthritis, right knee: Secondary | ICD-10-CM | POA: Diagnosis not present

## 2023-11-23 DIAGNOSIS — N951 Menopausal and female climacteric states: Secondary | ICD-10-CM

## 2023-11-23 LAB — BAYER DCA HB A1C WAIVED: HB A1C (BAYER DCA - WAIVED): 7 % — ABNORMAL HIGH (ref 4.8–5.6)

## 2023-11-23 MED ORDER — MELOXICAM 15 MG PO TABS: 15.0000 mg | ORAL_TABLET | Freq: Every day | ORAL | 2 refills | Status: DC

## 2023-11-23 MED ORDER — PRAVASTATIN SODIUM 40 MG PO TABS
ORAL_TABLET | ORAL | 1 refills | Status: DC
Start: 2023-11-23 — End: 2024-05-24

## 2023-11-23 NOTE — Progress Notes (Signed)
Subjective:    Patient ID: Lori Livingston, female    DOB: 1956-09-30, 67 y.o.   MRN: [id number]  Chief Complaint  Patient presents with   Medical Management of Chronic Issues    Fasting, no concerns     PT presents to the office today for chronic follow up. PT is followed by gynecologists every year. Pt is on Estrace after her hysterectomy in 2010 for hot flashes.    Pt is followed by Ortho for bilateral knee pain as needed. She had a right knee replaced in  2019.   She reports she retired May 2022.  Arthritis Presents for follow-up visit. She complains of pain and stiffness. The symptoms have been stable. Affected locations include the left knee and right knee. Her pain is at a severity of 7/10.  Diabetes She presents for her follow-up diabetic visit. Diabetes type: prediabetes. Pertinent negatives for diabetes include no blurred vision and no foot paresthesias. (Does not check glucose at home)  Hyperlipidemia This is a chronic problem. The current episode started more than 1 year ago. The problem is controlled. Recent lipid tests were reviewed and are normal. Exacerbating diseases include obesity. Current antihyperlipidemic treatment includes statins. The current treatment provides moderate improvement of lipids. Risk factors for coronary artery disease include dyslipidemia, hypertension, a sedentary lifestyle and post-menopausal.      Review of Systems  Eyes:  Negative for blurred vision.  Musculoskeletal:  Positive for arthritis and stiffness.  All other systems reviewed and are negative.      Objective:   Physical Exam Vitals reviewed.  Constitutional:      General: She is not in acute distress.    Appearance: She is well-developed. She is obese.  HENT:     Head: Normocephalic and atraumatic.     Right Ear: Tympanic membrane normal.     Left Ear: Tympanic membrane normal.  Eyes:     Pupils: Pupils are equal, round, and reactive to light.  Neck:     Thyroid: No  thyromegaly.  Cardiovascular:     Rate and Rhythm: Normal rate and regular rhythm.     Heart sounds: Normal heart sounds. No murmur heard. Pulmonary:     Effort: Pulmonary effort is normal. No respiratory distress.     Breath sounds: Normal breath sounds. No wheezing.  Abdominal:     General: Bowel sounds are normal. There is no distension.     Palpations: Abdomen is soft.     Tenderness: There is no abdominal tenderness.  Musculoskeletal:        General: No tenderness. Normal range of motion.     Cervical back: Normal range of motion and neck supple.  Skin:    General: Skin is warm and dry.  Neurological:     Mental Status: She is alert and oriented to person, place, and time.     Cranial Nerves: No cranial nerve deficit.     Deep Tendon Reflexes: Reflexes are normal and symmetric.  Psychiatric:        Behavior: Behavior normal.        Thought Content: Thought content normal.        Judgment: Judgment normal.          BP 133/79   Pulse 67   Temp (!) 97.1 F (36.2 C) (Temporal)   Ht 5\' 7"  (1.702 m)   Wt 196 lb (88.9 kg)   SpO2 97%   BMI 30.70 kg/m   Assessment & Plan:  Lori Livingston  Lori Livingston comes in today with chief complaint of Medical Management of Chronic Issues (Fasting, no concerns )   Diagnosis and orders addressed:  1. Arthritis of knee, right - meloxicam (MOBIC) 15 MG tablet; Take 1 tablet (15 mg total) by mouth daily.  Dispense: 90 tablet; Refill: 2 - CMP14+EGFR  2. Hyperlipidemia, unspecified hyperlipidemia type - pravastatin (PRAVACHOL) 40 MG tablet; TABLET ONE TABLET AT BEDTIME  Dispense: 90 tablet; Refill: 1 - CMP14+EGFR  3. Vitamin D deficiency - CMP14+EGFR  4. Menopausal syndrome on hormone replacement therapy - CMP14+EGFR  5. Obesity (BMI 30-39.9) - CMP14+EGFR  6. Prediabetes  - Bayer DCA Hb A1c Waived - CMP14+EGFR   Labs pending Health Maintenance reviewed Diet and exercise encouraged  Follow up plan: 6 months   Jannifer Rodney,  FNP

## 2023-11-23 NOTE — Patient Instructions (Signed)

## 2023-11-24 LAB — CMP14+EGFR
ALT: 25 [IU]/L (ref 0–32)
AST: 23 [IU]/L (ref 0–40)
Albumin: 4.3 g/dL (ref 3.9–4.9)
Alkaline Phosphatase: 92 [IU]/L (ref 44–121)
BUN/Creatinine Ratio: 28 (ref 12–28)
BUN: 16 mg/dL (ref 8–27)
Bilirubin Total: 0.8 mg/dL (ref 0.0–1.2)
CO2: 19 mmol/L — ABNORMAL LOW (ref 20–29)
Calcium: 9.3 mg/dL (ref 8.7–10.3)
Chloride: 103 mmol/L (ref 96–106)
Creatinine, Ser: 0.58 mg/dL (ref 0.57–1.00)
Globulin, Total: 2.6 g/dL (ref 1.5–4.5)
Glucose: 149 mg/dL — ABNORMAL HIGH (ref 70–99)
Potassium: 4.1 mmol/L (ref 3.5–5.2)
Sodium: 141 mmol/L (ref 134–144)
Total Protein: 6.9 g/dL (ref 6.0–8.5)
eGFR: 99 mL/min/{1.73_m2} (ref >59)

## 2023-12-24 DIAGNOSIS — R1013 Epigastric pain: Secondary | ICD-10-CM | POA: Diagnosis not present

## 2023-12-24 DIAGNOSIS — K219 Gastro-esophageal reflux disease without esophagitis: Secondary | ICD-10-CM | POA: Diagnosis not present

## 2023-12-24 DIAGNOSIS — R1310 Dysphagia, unspecified: Secondary | ICD-10-CM | POA: Diagnosis not present

## 2023-12-31 DIAGNOSIS — R1310 Dysphagia, unspecified: Secondary | ICD-10-CM | POA: Diagnosis not present

## 2023-12-31 DIAGNOSIS — R1013 Epigastric pain: Secondary | ICD-10-CM | POA: Diagnosis not present

## 2023-12-31 DIAGNOSIS — K3189 Other diseases of stomach and duodenum: Secondary | ICD-10-CM | POA: Diagnosis not present

## 2023-12-31 DIAGNOSIS — R1319 Other dysphagia: Secondary | ICD-10-CM | POA: Diagnosis not present

## 2023-12-31 DIAGNOSIS — K2950 Unspecified chronic gastritis without bleeding: Secondary | ICD-10-CM | POA: Diagnosis not present

## 2024-01-13 DIAGNOSIS — H2513 Age-related nuclear cataract, bilateral: Secondary | ICD-10-CM | POA: Diagnosis not present

## 2024-01-13 DIAGNOSIS — H40013 Open angle with borderline findings, low risk, bilateral: Secondary | ICD-10-CM | POA: Diagnosis not present

## 2024-01-13 LAB — HM DIABETES EYE EXAM

## 2024-01-18 ENCOUNTER — Other Ambulatory Visit: Payer: Self-pay | Admitting: Nurse Practitioner

## 2024-01-21 DIAGNOSIS — M542 Cervicalgia: Secondary | ICD-10-CM | POA: Diagnosis not present

## 2024-01-21 DIAGNOSIS — E042 Nontoxic multinodular goiter: Secondary | ICD-10-CM | POA: Diagnosis not present

## 2024-02-05 DIAGNOSIS — R1310 Dysphagia, unspecified: Secondary | ICD-10-CM | POA: Diagnosis not present

## 2024-02-05 DIAGNOSIS — K219 Gastro-esophageal reflux disease without esophagitis: Secondary | ICD-10-CM | POA: Diagnosis not present

## 2024-02-05 DIAGNOSIS — K209 Esophagitis, unspecified without bleeding: Secondary | ICD-10-CM | POA: Diagnosis not present

## 2024-03-04 DIAGNOSIS — H903 Sensorineural hearing loss, bilateral: Secondary | ICD-10-CM | POA: Diagnosis not present

## 2024-03-15 DIAGNOSIS — H9041 Sensorineural hearing loss, unilateral, right ear, with unrestricted hearing on the contralateral side: Secondary | ICD-10-CM | POA: Diagnosis not present

## 2024-03-15 DIAGNOSIS — H9311 Tinnitus, right ear: Secondary | ICD-10-CM | POA: Diagnosis not present

## 2024-03-30 ENCOUNTER — Encounter: Payer: Self-pay | Admitting: Family Medicine

## 2024-03-30 ENCOUNTER — Ambulatory Visit: Admitting: Family Medicine

## 2024-03-30 VITALS — BP 116/65 | HR 75 | Temp 98.2°F | Ht 67.0 in | Wt 196.0 lb

## 2024-03-30 DIAGNOSIS — J0100 Acute maxillary sinusitis, unspecified: Secondary | ICD-10-CM

## 2024-03-30 DIAGNOSIS — R051 Acute cough: Secondary | ICD-10-CM | POA: Diagnosis not present

## 2024-03-30 MED ORDER — BENZONATATE 100 MG PO CAPS
100.0000 mg | ORAL_CAPSULE | Freq: Three times a day (TID) | ORAL | 0 refills | Status: DC | PRN
Start: 2024-03-30 — End: 2024-05-10

## 2024-03-30 MED ORDER — AMOXICILLIN-POT CLAVULANATE 875-125 MG PO TABS: 1.0000 | ORAL_TABLET | Freq: Two times a day (BID) | ORAL | 0 refills | Status: AC

## 2024-03-30 NOTE — Progress Notes (Signed)
 Subjective:  Patient ID: Lori Livingston, female    DOB: January 22, 1956, 68 y.o.   MRN: [id number]  Patient Care Team: Junie Spencer, FNP as PCP - General (Nurse Practitioner) Loreta Ave, MD as Referring Physician (Student)   Chief Complaint:  cough, congestion (X 10 days/Post nasal drip/Mild sore throat/Sinus pressure/pain)   HPI: Lori Livingston is a 68 y.o. female presenting on 03/30/2024 for cough, congestion (X 10 days/Post nasal drip/Mild sore throat/Sinus pressure/pain)  HPI States that symptoms started on 4/5. States that she tried mucinex. She started feeling better and yesterday everything worsened again. She complains of cough, rhinorrhea, sore throat, sinus pressure, PND. Denies shortness of breath. Reports some ear pain.  She is also trying tylenol.    Relevant past medical, surgical, family, and social history reviewed and updated as indicated.  Allergies and medications reviewed and updated. Data reviewed: Chart in Epic.   Past Medical History:  Diagnosis Date   Arthritis    Right knee   Hyperlipidemia    Neuromuscular disorder (HCC)    Right knee meniscus tear   Vitamin D deficiency    Warts     Past Surgical History:  Procedure Laterality Date   APPENDECTOMY  02/1988   CHOLECYSTECTOMY  02/1988   SHOULDER SURGERY Right    TONSILLECTOMY  1964    Social History   Socioeconomic History   Marital status: Married    Spouse name: Not on file   Number of children: Not on file   Years of education: Not on file   Highest education level: Not on file  Occupational History   Not on file  Tobacco Use   Smoking status: Former    Current packs/day: 0.00    Types: Cigarettes    Quit date: 07/06/1978    Years since quitting: 45.7   Smokeless tobacco: Never  Vaping Use   Vaping status: Never Used  Substance and Sexual Activity   Alcohol use: No   Drug use: No   Sexual activity: Not on file  Other Topics Concern   Not on file  Social History  Narrative   Not on file   Social Drivers of Health   Financial Resource Strain: Low Risk  (08/07/2023)   Overall Financial Resource Strain (CARDIA)    Difficulty of Paying Living Expenses: Not hard at all  Food Insecurity: No Food Insecurity (08/07/2023)   Hunger Vital Sign    Worried About Running Out of Food in the Last Year: Never true    Ran Out of Food in the Last Year: Never true  Transportation Needs: No Transportation Needs (08/07/2023)   PRAPARE - Administrator, Civil Service (Medical): No    Lack of Transportation (Non-Medical): No  Physical Activity: Insufficiently Active (08/07/2023)   Exercise Vital Sign    Days of Exercise per Week: 2 days    Minutes of Exercise per Session: 20 min  Stress: No Stress Concern Present (08/07/2023)   [employer] of Occupational Health - Occupational Stress Questionnaire    Feeling of Stress : Not at all  Social Connections: Socially Integrated (08/07/2023)   Social Connection and Isolation Panel [NHANES]    Frequency of Communication with Friends and Family: More than three times a week    Frequency of Social Gatherings with Friends and Family: More than three times a week    Attends Religious Services: More than 4 times per year    Active Member of Clubs or  Organizations: Yes    Attends Engineer, structural: More than 4 times per year    Marital Status: Married  Catering manager Violence: Not At Risk (08/07/2023)   Humiliation, Afraid, Rape, and Kick questionnaire    Fear of Current or Ex-Partner: No    Emotionally Abused: No    Physically Abused: No    Sexually Abused: No    Outpatient Encounter Medications as of 03/30/2024  Medication Sig   Cetirizine HCl (ZYRTEC ALLERGY PO) Take by mouth.   clobetasol cream (TEMOVATE) 0.05 % Apply 1 application topically 2 (two) times daily.   Coenzyme Q10 200 MG capsule Take 200 mg by mouth daily.   Ergocalciferol (VITAMIN D2) 2000 UNITS TABS Take 2,000 Int'l Units by  mouth daily.   estradiol (ESTRACE) 0.5 MG tablet    fluticasone (FLONASE) 50 MCG/ACT nasal spray 2 SPRAYS INTO EACH NOSTRIL ONCE A DAY   meloxicam (MOBIC) 15 MG tablet Take 1 tablet (15 mg total) by mouth daily.   Multiple Vitamin (MULTIVITAMIN) tablet Take 1 tablet by mouth daily.   omeprazole (PRILOSEC) 40 MG capsule 40 mg.   pravastatin (PRAVACHOL) 40 MG tablet TABLET ONE TABLET AT BEDTIME   [DISCONTINUED] omeprazole (PRILOSEC OTC) 20 MG tablet Take 20 mg by mouth daily.   No facility-administered encounter medications on file as of 03/30/2024.    Allergies  Allergen Reactions   Sulfamethoxazole-Trimethoprim Itching   Celebrex [Celecoxib]    Indomethacin    Propoxyphene N-Acetaminophen    Nickel Rash   Propoxyphene Nausea Only    Review of Systems  Objective:  BP 116/65   Pulse 75   Temp 98.2 F (36.8 C)   Ht 5\' 7"  (1.702 m)   Wt 196 lb (88.9 kg)   SpO2 95%   BMI 30.70 kg/m    Wt Readings from Last 3 Encounters:  03/30/24 196 lb (88.9 kg)  11/23/23 196 lb (88.9 kg)  09/18/23 194 lb 9.6 oz (88.3 kg)    Physical Exam Constitutional:      General: She is awake. She is not in acute distress.    Appearance: Normal appearance. She is well-developed and well-groomed. She is obese. She is not ill-appearing, toxic-appearing or diaphoretic.  HENT:     Right Ear: Tenderness present. A middle ear effusion is present. Tympanic membrane is injected.     Left Ear: Tenderness present. A middle ear effusion is present. Tympanic membrane is injected.     Nose: Congestion and rhinorrhea present. Rhinorrhea is clear.     Right Nostril: No occlusion.     Left Nostril: No occlusion.     Right Turbinates: Enlarged and swollen.     Left Turbinates: Enlarged and swollen.     Right Sinus: Maxillary sinus tenderness present. No frontal sinus tenderness.     Left Sinus: Maxillary sinus tenderness present. No frontal sinus tenderness.     Mouth/Throat:     Lips: Pink. No lesions.      Mouth: Mucous membranes are moist.     Tongue: No lesions.     Palate: No mass.     Pharynx: Posterior oropharyngeal erythema and postnasal drip present. No pharyngeal swelling or oropharyngeal exudate.     Tonsils: No tonsillar exudate or tonsillar abscesses.  Cardiovascular:     Rate and Rhythm: Normal rate and regular rhythm.     Pulses: Normal pulses.          Radial pulses are 2+ on the right side and 2+ on the  left side.       Posterior tibial pulses are 2+ on the right side and 2+ on the left side.     Heart sounds: Normal heart sounds. No murmur heard.    No gallop.  Pulmonary:     Effort: Pulmonary effort is normal. No respiratory distress.     Breath sounds: Normal breath sounds. No stridor. No wheezing, rhonchi or rales.  Musculoskeletal:     Cervical back: Full passive range of motion without pain and neck supple.     Right lower leg: No edema.     Left lower leg: No edema.  Lymphadenopathy:     Head:     Right side of head: No submental, submandibular, tonsillar, preauricular or posterior auricular adenopathy.     Left side of head: No submental, submandibular, tonsillar, preauricular or posterior auricular adenopathy.     Cervical:     Right cervical: No superficial cervical adenopathy.    Left cervical: No superficial cervical adenopathy.     Comments: Tender to palpation   Skin:    General: Skin is warm.     Capillary Refill: Capillary refill takes less than 2 seconds.  Neurological:     General: No focal deficit present.     Mental Status: She is alert, oriented to person, place, and time and easily aroused. Mental status is at baseline.     GCS: GCS eye subscore is 4. GCS verbal subscore is 5. GCS motor subscore is 6.     Motor: No weakness.  Psychiatric:        Attention and Perception: Attention and perception normal.        Mood and Affect: Mood and affect normal.        Speech: Speech normal.        Behavior: Behavior normal. Behavior is cooperative.         Thought Content: Thought content normal. Thought content does not include homicidal or suicidal ideation. Thought content does not include homicidal or suicidal plan.        Cognition and Memory: Cognition and memory normal.        Judgment: Judgment normal.     Results for orders placed or performed in visit on 11/23/23  Bayer DCA Hb A1c Waived   Collection Time: 11/23/23  8:50 AM  Result Value Ref Range   HB A1C (BAYER DCA - WAIVED) 7.0 (H) 4.8 - 5.6 %  CMP14+EGFR   Collection Time: 11/23/23  8:55 AM  Result Value Ref Range   Glucose 149 (H) 70 - 99 mg/dL   BUN 16 8 - 27 mg/dL   Creatinine, Ser 1.61 0.57 - 1.00 mg/dL   eGFR 99 >09 UE/AVW/0.98   BUN/Creatinine Ratio 28 12 - 28   Sodium 141 134 - 144 mmol/L   Potassium 4.1 3.5 - 5.2 mmol/L   Chloride 103 96 - 106 mmol/L   CO2 19 (L) 20 - 29 mmol/L   Calcium 9.3 8.7 - 10.3 mg/dL   Total Protein 6.9 6.0 - 8.5 g/dL   Albumin 4.3 3.9 - 4.9 g/dL   Globulin, Total 2.6 1.5 - 4.5 g/dL   Bilirubin Total 0.8 0.0 - 1.2 mg/dL   Alkaline Phosphatase 92 44 - 121 IU/L   AST 23 0 - 40 IU/L   ALT 25 0 - 32 IU/L       03/30/2024    2:24 PM 11/23/2023    8:19 AM 08/07/2023    8:07 AM 05/15/2023  8:02 AM 12/29/2022   12:06 PM  Depression screen PHQ 2/9  Decreased Interest 0 0 0 0 0  Down, Depressed, Hopeless 0 0 0 0 0  PHQ - 2 Score 0 0 0 0 0  Altered sleeping  0  0 0  Tired, decreased energy  0  0 0  Change in appetite  0  0 0  Feeling bad or failure about yourself   0  0 0  Trouble concentrating  0  0 0  Moving slowly or fidgety/restless  0  0 0  Suicidal thoughts  0  0 0  PHQ-9 Score  0  0 0  Difficult doing work/chores  Not difficult at all  Not difficult at all Not difficult at all       03/30/2024    2:24 PM 11/23/2023    8:19 AM 12/29/2022   12:06 PM 01/02/2022    9:08 AM  GAD 7 : Generalized Anxiety Score  Nervous, Anxious, on Edge 0 0 0 0  Control/stop worrying 0 0 0 0  Worry too much - different things 0 0 0 0   Trouble relaxing 0 0 0 0  Restless 0 0 0 0  Easily annoyed or irritable 0 0 0 0  Afraid - awful might happen 0 0 0 0  Total GAD 7 Score 0 0 0 0  Anxiety Difficulty Not difficult at all Not difficult at all Not difficult at all Not difficult at all   Pertinent labs & imaging results that were available during my care of the patient were reviewed by me and considered in my medical decision making.  Assessment & Plan:  Eveleigh was seen today for cough, congestion.  Diagnoses and all orders for this visit:  Acute non-recurrent maxillary sinusitis Will start medication as below. Discussed red flag symptoms with patient. Enocuraged her to continue supportive care at home.  -     benzonatate (TESSALON PERLES) 100 MG capsule; Take 1 capsule (100 mg total) by mouth 3 (three) times daily as needed. -     amoxicillin-clavulanate (AUGMENTIN) 875-125 MG tablet; Take 1 tablet by mouth 2 (two) times daily for 7 days.  Acute cough As above.  -     benzonatate (TESSALON PERLES) 100 MG capsule; Take 1 capsule (100 mg total) by mouth 3 (three) times daily as needed. -     amoxicillin-clavulanate (AUGMENTIN) 875-125 MG tablet; Take 1 tablet by mouth 2 (two) times daily for 7 days.   Continue all other maintenance medications.  Follow up plan: Return if symptoms worsen or fail to improve.   Continue healthy lifestyle choices, including diet (rich in fruits, vegetables, and lean proteins, and low in salt and simple carbohydrates) and exercise (at least 30 minutes of moderate physical activity daily).  Written and verbal instructions provided   The above assessment and management plan was discussed with the patient. The patient verbalized understanding of and has agreed to the management plan. Patient is aware to call the clinic if they develop any new symptoms or if symptoms persist or worsen. Patient is aware when to return to the clinic for a follow-up visit. Patient educated on when it is  appropriate to go to the emergency department.   Neale Burly, DNP-FNP [facility] [address] [place], [place] [address] [phone]

## 2024-04-14 DIAGNOSIS — L03031 Cellulitis of right toe: Secondary | ICD-10-CM | POA: Diagnosis not present

## 2024-04-14 DIAGNOSIS — M79674 Pain in right toe(s): Secondary | ICD-10-CM | POA: Diagnosis not present

## 2024-05-03 DIAGNOSIS — L03031 Cellulitis of right toe: Secondary | ICD-10-CM | POA: Diagnosis not present

## 2024-05-10 ENCOUNTER — Encounter: Payer: Self-pay | Admitting: Family Medicine

## 2024-05-10 ENCOUNTER — Ambulatory Visit (INDEPENDENT_AMBULATORY_CARE_PROVIDER_SITE_OTHER): Admitting: Family Medicine

## 2024-05-10 VITALS — BP 134/79 | HR 72 | Temp 96.6°F | Ht 67.0 in | Wt 195.0 lb

## 2024-05-10 DIAGNOSIS — R051 Acute cough: Secondary | ICD-10-CM | POA: Diagnosis not present

## 2024-05-10 DIAGNOSIS — J0101 Acute recurrent maxillary sinusitis: Secondary | ICD-10-CM

## 2024-05-10 MED ORDER — BENZONATATE 100 MG PO CAPS: 100.0000 mg | ORAL_CAPSULE | Freq: Three times a day (TID) | ORAL | 0 refills | Status: DC | PRN

## 2024-05-10 MED ORDER — FLUTICASONE PROPIONATE 50 MCG/ACT NA SUSP: 2.0000 | Freq: Every day | NASAL | 2 refills | Status: AC

## 2024-05-10 MED ORDER — DOXYCYCLINE HYCLATE 100 MG PO TABS
100.0000 mg | ORAL_TABLET | Freq: Two times a day (BID) | ORAL | 0 refills | Status: DC
Start: 2024-05-10 — End: 2024-05-17

## 2024-05-10 NOTE — Progress Notes (Addendum)
 Subjective:  Patient ID: Lori Livingston, female    DOB: [birth year], 68 y.o.   MRN: [id number]  Patient Care Team: Yevette Hem, FNP as PCP - General (Nurse Practitioner) Alvaro Jim, MD as Referring Physician (Student)   Chief Complaint:  Cough, Nasal Congestion, Sore Throat, and faical pressure (X 1 week )   HPI: Lori Livingston is a 68 y.o. female presenting on 05/10/2024 for Cough, Nasal Congestion, Sore Throat, and faical pressure (X 1 week )   Cough  Sore Throat  Associated symptoms include coughing.   States that symptoms started last Monday. Started with scratchy throat, fatigue. Increased to cough, rhinorrhea with yellow/green mucus. Reports sinus pressure and right ear "roaring". Mild headaches. Taking tylenol and flonase . Tried tessalon  perles for cough, which help some. She was treated about 6 weeks ago for similar symptoms with Augmentin  and prior to dental cleaning on 28th of April took  amoxicllin. States that she normally does not get sinus infections back to back like this, but was around sick contacts and believes she contracted it from her son. Reports some shortness of breath with lying down at night    Relevant past medical, surgical, family, and social history reviewed and updated as indicated.  Allergies and medications reviewed and updated. Data reviewed: Chart in Epic.   Past Medical History:  Diagnosis Date   Arthritis    Right knee   Hyperlipidemia    Neuromuscular disorder (HCC)    Right knee meniscus tear   Vitamin D  deficiency    Warts     Past Surgical History:  Procedure Laterality Date   APPENDECTOMY  02/1988   CHOLECYSTECTOMY  02/1988   SHOULDER SURGERY Right    TONSILLECTOMY  1964    Social History   Socioeconomic History   Marital status: Married    Spouse name: Not on file   Number of children: Not on file   Years of education: Not on file   Highest education level: Not on file  Occupational History   Not on file   Tobacco Use   Smoking status: Former    Current packs/day: 0.00    Types: Cigarettes    Quit date: 07/06/1978    Years since quitting: 45.8   Smokeless tobacco: Never  Vaping Use   Vaping status: Never Used  Substance and Sexual Activity   Alcohol use: No   Drug use: No   Sexual activity: Not on file  Other Topics Concern   Not on file  Social History Narrative   Not on file   Social Drivers of Health   Financial Resource Strain: Low Risk  (08/07/2023)   Overall Financial Resource Strain (CARDIA)    Difficulty of Paying Living Expenses: Not hard at all  Food Insecurity: No Food Insecurity (08/07/2023)   Hunger Vital Sign    Worried About Running Out of Food in the Last Year: Never true    Ran Out of Food in the Last Year: Never true  Transportation Needs: No Transportation Needs (08/07/2023)   PRAPARE - Administrator, Civil Service (Medical): No    Lack of Transportation (Non-Medical): No  Physical Activity: Insufficiently Active (08/07/2023)   Exercise Vital Sign    Days of Exercise per Week: 2 days    Minutes of Exercise per Session: 20 min  Stress: No Stress Concern Present (08/07/2023)   [employer] of Occupational Health - Occupational Stress Questionnaire    Feeling of Stress :  Not at all  Social Connections: Socially Integrated (08/07/2023)   Social Connection and Isolation Panel [NHANES]    Frequency of Communication with Friends and Family: More than three times a week    Frequency of Social Gatherings with Friends and Family: More than three times a week    Attends Religious Services: More than 4 times per year    Active Member of [employer] or Organizations: Yes    Attends Engineer, structural: More than 4 times per year    Marital Status: Married  Catering manager Violence: Not At Risk (08/07/2023)   Humiliation, Afraid, Rape, and Kick questionnaire    Fear of Current or Ex-Partner: No    Emotionally Abused: No    Physically Abused: No     Sexually Abused: No    Outpatient Encounter Medications as of 05/10/2024  Medication Sig   benzonatate  (TESSALON  PERLES) 100 MG capsule Take 1 capsule (100 mg total) by mouth 3 (three) times daily as needed.   Cetirizine HCl (ZYRTEC ALLERGY PO) Take by mouth.   clobetasol  cream (TEMOVATE ) 0.05 % Apply 1 application topically 2 (two) times daily.   Coenzyme Q10 200 MG capsule Take 200 mg by mouth daily.   Ergocalciferol  (VITAMIN D2) 2000 UNITS TABS Take 2,000 Int'l Units by mouth daily.   estradiol (ESTRACE) 0.5 MG tablet    fluticasone  (FLONASE ) 50 MCG/ACT nasal spray 2 SPRAYS INTO EACH NOSTRIL ONCE A DAY   meloxicam  (MOBIC ) 15 MG tablet Take 1 tablet (15 mg total) by mouth daily.   Multiple Vitamin (MULTIVITAMIN) tablet Take 1 tablet by mouth daily.   omeprazole (PRILOSEC) 40 MG capsule 40 mg.   pravastatin  (PRAVACHOL ) 40 MG tablet TABLET ONE TABLET AT BEDTIME   No facility-administered encounter medications on file as of 05/10/2024.    Allergies  Allergen Reactions   Sulfamethoxazole-Trimethoprim Itching   Celebrex [Celecoxib]    Indomethacin    Propoxyphene N-Acetaminophen    Nickel Rash   Propoxyphene Nausea Only    Review of Systems  Respiratory:  Positive for cough.     Objective:  BP 134/79   Pulse 72   Temp (!) 96.6 F (35.9 C)   Ht 5\' 7"  (1.702 m)   Wt 195 lb (88.5 kg)   SpO2 97%   BMI 30.54 kg/m    Wt Readings from Last 3 Encounters:  05/10/24 195 lb (88.5 kg)  03/30/24 196 lb (88.9 kg)  11/23/23 196 lb (88.9 kg)    Physical Exam Constitutional:      General: She is awake. She is not in acute distress.    Appearance: Normal appearance. She is well-developed and well-groomed. She is not ill-appearing, toxic-appearing or diaphoretic.  HENT:     Right Ear: A middle ear effusion is present. Tympanic membrane is injected and scarred.     Left Ear: A middle ear effusion is present.     Nose: Congestion and rhinorrhea present. Rhinorrhea is clear.     Right  Turbinates: Enlarged and swollen.     Left Turbinates: Enlarged and swollen.     Right Sinus: Maxillary sinus tenderness and frontal sinus tenderness present.     Left Sinus: Maxillary sinus tenderness and frontal sinus tenderness present.     Mouth/Throat:     Lips: Pink. No lesions.     Mouth: Mucous membranes are moist.     Tongue: No lesions.     Palate: No mass.     Pharynx: Posterior oropharyngeal erythema and postnasal drip  present. No pharyngeal swelling, oropharyngeal exudate or uvula swelling.     Tonsils: No tonsillar exudate or tonsillar abscesses.  Cardiovascular:     Rate and Rhythm: Normal rate and regular rhythm.     Pulses: Normal pulses.          Radial pulses are 2+ on the right side and 2+ on the left side.       Posterior tibial pulses are 2+ on the right side and 2+ on the left side.     Heart sounds: Normal heart sounds. No murmur heard.    No gallop.  Pulmonary:     Effort: Pulmonary effort is normal. No respiratory distress.     Breath sounds: Decreased air movement present. No stridor. Examination of the right-upper field reveals decreased breath sounds. Examination of the left-upper field reveals decreased breath sounds. Examination of the right-middle field reveals decreased breath sounds. Examination of the left-middle field reveals decreased breath sounds. Examination of the right-lower field reveals decreased breath sounds. Examination of the left-lower field reveals decreased breath sounds. Decreased breath sounds present. No wheezing, rhonchi or rales.  Musculoskeletal:     Cervical back: Full passive range of motion without pain and neck supple.     Right lower leg: No edema.     Left lower leg: No edema.  Lymphadenopathy:     Head:     Right side of head: No submental, submandibular, tonsillar, preauricular or posterior auricular adenopathy.     Left side of head: No submental, submandibular, tonsillar, preauricular or posterior auricular adenopathy.      Cervical:     Right cervical: No superficial cervical adenopathy.    Left cervical: No superficial cervical adenopathy.     Comments: Tenderness along left anterior cervical chain   Skin:    General: Skin is warm.     Capillary Refill: Capillary refill takes less than 2 seconds.  Neurological:     General: No focal deficit present.     Mental Status: She is alert, oriented to person, place, and time and easily aroused. Mental status is at baseline.     GCS: GCS eye subscore is 4. GCS verbal subscore is 5. GCS motor subscore is 6.     Motor: No weakness.  Psychiatric:        Attention and Perception: Attention and perception normal.        Mood and Affect: Mood and affect normal.        Speech: Speech normal.        Behavior: Behavior normal. Behavior is cooperative.        Thought Content: Thought content normal. Thought content does not include homicidal or suicidal ideation. Thought content does not include homicidal or suicidal plan.        Cognition and Memory: Cognition and memory normal.        Judgment: Judgment normal.     Results for orders placed or performed in visit on 11/23/23  Bayer DCA Hb A1c Waived   Collection Time: 11/23/23  8:50 AM  Result Value Ref Range   HB A1C (BAYER DCA - WAIVED) 7.0 (H) 4.8 - 5.6 %  CMP14+EGFR   Collection Time: 11/23/23  8:55 AM  Result Value Ref Range   Glucose 149 (H) 70 - 99 mg/dL   BUN 16 8 - 27 mg/dL   Creatinine, Ser 1.61 0.57 - 1.00 mg/dL   eGFR 99 >09 UE/AVW/0.98   BUN/Creatinine Ratio 28 12 - 28   Sodium  141 134 - 144 mmol/L   Potassium 4.1 3.5 - 5.2 mmol/L   Chloride 103 96 - 106 mmol/L   CO2 19 (L) 20 - 29 mmol/L   Calcium 9.3 8.7 - 10.3 mg/dL   Total Protein 6.9 6.0 - 8.5 g/dL   Albumin 4.3 3.9 - 4.9 g/dL   Globulin, Total 2.6 1.5 - 4.5 g/dL   Bilirubin Total 0.8 0.0 - 1.2 mg/dL   Alkaline Phosphatase 92 44 - 121 IU/L   AST 23 0 - 40 IU/L   ALT 25 0 - 32 IU/L       03/30/2024    2:24 PM 11/23/2023    8:19 AM  08/07/2023    8:07 AM 05/15/2023    8:02 AM 12/29/2022   12:06 PM  Depression screen PHQ 2/9  Decreased Interest 0 0 0 0 0  Down, Depressed, Hopeless 0 0 0 0 0  PHQ - 2 Score 0 0 0 0 0  Altered sleeping  0  0 0  Tired, decreased energy  0  0 0  Change in appetite  0  0 0  Feeling bad or failure about yourself   0  0 0  Trouble concentrating  0  0 0  Moving slowly or fidgety/restless  0  0 0  Suicidal thoughts  0  0 0  PHQ-9 Score  0  0 0  Difficult doing work/chores  Not difficult at all  Not difficult at all Not difficult at all       03/30/2024    2:24 PM 11/23/2023    8:19 AM 12/29/2022   12:06 PM 01/02/2022    9:08 AM  GAD 7 : Generalized Anxiety Score  Nervous, Anxious, on Edge 0 0 0 0  Control/stop worrying 0 0 0 0  Worry too much - different things 0 0 0 0  Trouble relaxing 0 0 0 0  Restless 0 0 0 0  Easily annoyed or irritable 0 0 0 0  Afraid - awful might happen 0 0 0 0  Total GAD 7 Score 0 0 0 0  Anxiety Difficulty Not difficult at all Not difficult at all Not difficult at all Not difficult at all   Pertinent labs & imaging results that were available during my care of the patient were reviewed by me and considered in my medical decision making.  Assessment & Plan:  Zykiria was seen today for cough, nasal congestion, sore throat and faical pressure.  Diagnoses and all orders for this visit:  Acute recurrent maxillary sinusitis Will start medications as below.  Encouraged patient to follow up if they have worsening symptoms. Discussed at home care such as humidifier, saline spray, throat lozenges, increasing hydration, and tylenol for pain or fever.  Discussed that if patient continues to have recurrent infections to follow up with ENT.  -     benzonatate  (TESSALON  PERLES) 100 MG capsule; Take 1 capsule (100 mg total) by mouth 3 (three) times daily as needed. -     doxycycline (VIBRA-TABS) 100 MG tablet; Take 1 tablet (100 mg total) by mouth 2 (two) times daily for 7  days. -     fluticasone  (FLONASE ) 50 MCG/ACT nasal spray; Place 2 sprays into both nostrils daily.  Acute cough As above.  -     benzonatate  (TESSALON  PERLES) 100 MG capsule; Take 1 capsule (100 mg total) by mouth 3 (three) times daily as needed. -     doxycycline (VIBRA-TABS) 100 MG tablet; Take  1 tablet (100 mg total) by mouth 2 (two) times daily for 7 days. -     fluticasone  (FLONASE ) 50 MCG/ACT nasal spray; Place 2 sprays into both nostrils daily.     Continue all other maintenance medications.  Follow up plan: Return if symptoms worsen or fail to improve.   Continue healthy lifestyle choices, including diet (rich in fruits, vegetables, and lean proteins, and low in salt and simple carbohydrates) and exercise (at least 30 minutes of moderate physical activity daily).  Written and verbal instructions provided   The above assessment and management plan was discussed with the patient. The patient verbalized understanding of and has agreed to the management plan. Patient is aware to call the clinic if they develop any new symptoms or if symptoms persist or worsen. Patient is aware when to return to the clinic for a follow-up visit. Patient educated on when it is appropriate to go to the emergency department.   Jacqualyn Mates, DNP-FNP [facility] [address] [place], [place] [address] [phone]

## 2024-05-17 ENCOUNTER — Ambulatory Visit: Admitting: Nurse Practitioner

## 2024-05-17 ENCOUNTER — Encounter: Payer: Self-pay | Admitting: Nurse Practitioner

## 2024-05-17 ENCOUNTER — Ambulatory Visit: Payer: Self-pay | Admitting: Family

## 2024-05-17 VITALS — BP 118/67 | HR 73 | Temp 97.6°F | Ht 67.0 in | Wt 195.0 lb

## 2024-05-17 DIAGNOSIS — J302 Other seasonal allergic rhinitis: Secondary | ICD-10-CM | POA: Diagnosis not present

## 2024-05-17 MED ORDER — PREDNISONE 20 MG PO TABS: 40.0000 mg | ORAL_TABLET | Freq: Every day | ORAL | 0 refills | Status: AC

## 2024-05-17 MED ORDER — LORATADINE 10 MG PO TABS: 10.0000 mg | ORAL_TABLET | Freq: Every day | ORAL | 11 refills | Status: DC

## 2024-05-17 NOTE — Telephone Encounter (Signed)
 Chief Complaint: Congestion  Symptoms: Yellow mucous, a little sore throat, sinus pressure across cheek, cough is improving Pertinent Negatives: Patient denies fever, earache  Disposition:  [x] Appointment (In office)  Additional Notes: Patient was seen in office last week and was prescribed antibiotics. Patient states her cough is better but she is having a lot of postnasal drip and congestion in head. Patient scheduled for an appointment today in office. This RN educated pt on new-worsening symptoms and when to call back/seek emergent care. Pt verbalized understanding and agrees to plan.    Copied from CRM [id number]. Topic: Clinical - Red Word Triage >> May 17, 2024 11:21 AM Tiffany H wrote: Red Word that prompted transfer to Nurse Triage: Patient has post nasal drip. Still not clear. Throat is sore in the morning. Not sure if it's due to mouth-breathing during sleep. Persistent sinus pressure. Color of mucus: pale-medium yellow. Patient prescribed:   benzonatate  (TESSALON  PERLES) 100 MG capsule; Take 1 capsule (100 mg total) by mouth 3 (three) times daily as needed. -     doxycycline  (VIBRA -TABS) 100 MG tablet; Take 1 tablet (100 mg total) by mouth 2 (two) times daily for 7 days. -     fluticasone  (FLONASE ) 50 MCG/ACT nasal spray; Place 2 sprays into both nostrils daily.  Finished final antibiotic yesterday. Please advise. Reason for Disposition  [1] Sinus congestion (pressure, fullness) AND [2] present > 10 days  Answer Assessment - Initial Assessment Questions Chief Complaint: Congestion  Symptoms: Yellow mucous, a little sore throat, sinus pressure across cheek, cough is improving  Pertinent Negatives: Patient denies fever, earache  Protocols used: Sinus Pain or Congestion-A-AH

## 2024-05-17 NOTE — Telephone Encounter (Signed)
 E2C2 scheduled appointment.

## 2024-05-17 NOTE — Progress Notes (Signed)
   Subjective:    Patient ID: VERDENE CRESON, female    DOB: 10-Apr-1956, 68 y.o.   MRN: [id number]   Chief Complaint: uri  Patient has been sick for 3 weeks. Saw G. Milan last week and was given antibiotic, flonase  and tessalon  perles. She is better but not "well". Still getting a lot of "stuff out of her head".    Patient Active Problem List   Diagnosis Date Noted   Prediabetes 05/05/2022   Obesity (BMI 30-39.9) 06/09/2016   Menopausal syndrome on hormone replacement therapy 11/16/2015   Hyperlipidemia 02/26/2013   Vitamin D  deficiency 02/26/2013   Arthritis of knee, right 02/26/2013       Review of Systems  Constitutional:  Positive for fatigue. Negative for chills and fever.  HENT:  Positive for congestion. Negative for sinus pressure.   Respiratory:  Positive for cough. Negative for shortness of breath.   Cardiovascular: Negative.        Objective:   Physical Exam Constitutional:      Appearance: Normal appearance. She is obese.  HENT:     Right Ear: Tympanic membrane normal.     Left Ear: Tympanic membrane normal.     Nose: Congestion and rhinorrhea present.     Mouth/Throat:     Mouth: Mucous membranes are moist.     Pharynx: No oropharyngeal exudate or posterior oropharyngeal erythema.  Cardiovascular:     Rate and Rhythm: Normal rate.     Heart sounds: Normal heart sounds.  Pulmonary:     Effort: Pulmonary effort is normal.     Breath sounds: Normal breath sounds.  Skin:    General: Skin is warm.  Neurological:     General: No focal deficit present.     Mental Status: She is alert and oriented to person, place, and time.  Psychiatric:        Mood and Affect: Mood normal.        Behavior: Behavior normal.    BP 118/67   Pulse 73   Temp 97.6 F (36.4 C) (Temporal)   Ht 5\' 7"  (1.702 m)   Wt 195 lb (88.5 kg)   SpO2 94%   BMI 30.54 kg/m         Assessment & Plan:   Jeri Mons in today with chief complaint of Nasal Congestion (Finished  doxy yesterday)   1. Seasonal allergic rhinitis, unspecified trigger (Primary) Force fluids Continue flonase  daily Run humidifier Vicks at night - predniSONE  (DELTASONE ) 20 MG tablet; Take 2 tablets (40 mg total) by mouth daily with breakfast for 5 days. 2 po daily for 5 days  Dispense: 10 tablet; Refill: 0 - loratadine (CLARITIN) 10 MG tablet; Take 1 tablet (10 mg total) by mouth daily.  Dispense: 30 tablet; Refill: 11    The above assessment and management plan was discussed with the patient. The patient verbalized understanding of and has agreed to the management plan. Patient is aware to call the clinic if symptoms persist or worsen. Patient is aware when to return to the clinic for a follow-up visit. Patient educated on when it is appropriate to go to the emergency department.   Mary-Margaret Gaylyn Keas, FNP

## 2024-05-17 NOTE — Patient Instructions (Signed)

## 2024-05-24 ENCOUNTER — Ambulatory Visit: Payer: Medicare Other | Admitting: Family

## 2024-05-24 ENCOUNTER — Encounter: Payer: Self-pay | Admitting: Family

## 2024-05-24 VITALS — BP 112/63 | HR 65 | Temp 97.1°F | Ht 67.0 in | Wt 192.0 lb

## 2024-05-24 DIAGNOSIS — R7303 Prediabetes: Secondary | ICD-10-CM | POA: Diagnosis not present

## 2024-05-24 DIAGNOSIS — Z0000 Encounter for general adult medical examination without abnormal findings: Secondary | ICD-10-CM

## 2024-05-24 DIAGNOSIS — Z6830 Body mass index (BMI) 30.0-30.9, adult: Secondary | ICD-10-CM

## 2024-05-24 DIAGNOSIS — Z0001 Encounter for general adult medical examination with abnormal findings: Secondary | ICD-10-CM

## 2024-05-24 DIAGNOSIS — E785 Hyperlipidemia, unspecified: Secondary | ICD-10-CM

## 2024-05-24 DIAGNOSIS — Z79890 Hormone replacement therapy (postmenopausal): Secondary | ICD-10-CM

## 2024-05-24 DIAGNOSIS — E669 Obesity, unspecified: Secondary | ICD-10-CM

## 2024-05-24 DIAGNOSIS — N951 Menopausal and female climacteric states: Secondary | ICD-10-CM

## 2024-05-24 DIAGNOSIS — M1711 Unilateral primary osteoarthritis, right knee: Secondary | ICD-10-CM

## 2024-05-24 LAB — CMP14+EGFR
ALT: 33 IU/L — ABNORMAL HIGH (ref 0–32)
AST: 35 IU/L (ref 0–40)
Albumin: 4.4 g/dL (ref 3.9–4.9)
Alkaline Phosphatase: 104 IU/L (ref 44–121)
BUN/Creatinine Ratio: 24 (ref 12–28)
BUN: 16 mg/dL (ref 8–27)
Bilirubin Total: 1.2 mg/dL (ref 0.0–1.2)
CO2: 21 mmol/L (ref 20–29)
Calcium: 9.4 mg/dL (ref 8.7–10.3)
Chloride: 102 mmol/L (ref 96–106)
Creatinine, Ser: 0.67 mg/dL (ref 0.57–1.00)
Globulin, Total: 2.5 g/dL (ref 1.5–4.5)
Glucose: 167 mg/dL — ABNORMAL HIGH (ref 70–99)
Potassium: 3.7 mmol/L (ref 3.5–5.2)
Sodium: 141 mmol/L (ref 134–144)
Total Protein: 6.9 g/dL (ref 6.0–8.5)
eGFR: 95 mL/min/{1.73_m2} (ref >59)

## 2024-05-24 LAB — CBC WITH DIFFERENTIAL/PLATELET
Basophils Absolute: 0.1 10*3/uL (ref 0.0–0.2)
Basos: 1 %
EOS (ABSOLUTE): 0.3 10*3/uL (ref 0.0–0.4)
Eos: 3 %
Hematocrit: 45.9 % (ref 34.0–46.6)
Hemoglobin: 15.4 g/dL (ref 11.1–15.9)
Immature Grans (Abs): 0 10*3/uL (ref 0.0–0.1)
Immature Granulocytes: 0 %
Lymphocytes Absolute: 5.4 10*3/uL — ABNORMAL HIGH (ref 0.7–3.1)
Lymphs: 45 %
MCH: 32 pg (ref 26.6–33.0)
MCHC: 33.6 g/dL (ref 31.5–35.7)
MCV: 95 fL (ref 79–97)
Monocytes Absolute: 0.9 10*3/uL (ref 0.1–0.9)
Monocytes: 7 %
Neutrophils Absolute: 5.3 10*3/uL (ref 1.4–7.0)
Neutrophils: 44 %
Platelets: 352 10*3/uL (ref 150–450)
RBC: 4.82 x10E6/uL (ref 3.77–5.28)
RDW: 13 % (ref 11.7–15.4)
WBC: 12 10*3/uL — ABNORMAL HIGH (ref 3.4–10.8)

## 2024-05-24 LAB — LIPID PANEL
Chol/HDL Ratio: 4.1 ratio (ref 0.0–4.4)
Cholesterol, Total: 174 mg/dL (ref 100–199)
HDL: 42 mg/dL (ref >39)
LDL Chol Calc (NIH): 93 mg/dL (ref 0–99)
Triglycerides: 228 mg/dL — ABNORMAL HIGH (ref 0–149)
VLDL Cholesterol Cal: 39 mg/dL (ref 5–40)

## 2024-05-24 LAB — BAYER DCA HB A1C WAIVED: HB A1C (BAYER DCA - WAIVED): 8.3 % — ABNORMAL HIGH (ref 4.8–5.6)

## 2024-05-24 MED ORDER — PRAVASTATIN SODIUM 40 MG PO TABS: ORAL_TABLET | ORAL | 3 refills | Status: AC

## 2024-05-24 NOTE — Patient Instructions (Signed)
 Health Maintenance After Age 68 After age 4, you are at a higher risk for certain long-term diseases and infections as well as injuries from falls. Falls are a major cause of broken bones and head injuries in people who are older than age 47. Getting regular preventive care can help to keep you healthy and well. Preventive care includes getting regular testing and making lifestyle changes as recommended by your health care provider. Talk with your health care provider about: Which screenings and tests you should have. A screening is a test that checks for a disease when you have no symptoms. A diet and exercise plan that is right for you. What should I know about screenings and tests to prevent falls? Screening and testing are the best ways to find a health problem early. Early diagnosis and treatment give you the best chance of managing medical conditions that are common after age 37. Certain conditions and lifestyle choices may make you more likely to have a fall. Your health care provider may recommend: Regular vision checks. Poor vision and conditions such as cataracts can make you more likely to have a fall. If you wear glasses, make sure to get your prescription updated if your vision changes. Medicine review. Work with your health care provider to regularly review all of the medicines you are taking, including over-the-counter medicines. Ask your health care provider about any side effects that may make you more likely to have a fall. Tell your health care provider if any medicines that you take make you feel dizzy or sleepy. Strength and balance checks. Your health care provider may recommend certain tests to check your strength and balance while standing, walking, or changing positions. Foot health exam. Foot pain and numbness, as well as not wearing proper footwear, can make you more likely to have a fall. Screenings, including: Osteoporosis screening. Osteoporosis is a condition that causes  the bones to get weaker and break more easily. Blood pressure screening. Blood pressure changes and medicines to control blood pressure can make you feel dizzy. Depression screening. You may be more likely to have a fall if you have a fear of falling, feel depressed, or feel unable to do activities that you used to do. Alcohol use screening. Using too much alcohol can affect your balance and may make you more likely to have a fall. Follow these instructions at home: Lifestyle Do not drink alcohol if: Your health care provider tells you not to drink. If you drink alcohol: Limit how much you have to: 0-1 drink a day for women. 0-2 drinks a day for men. Know how much alcohol is in your drink. In the [place] one drink equals one 12 oz bottle of beer (355 mL), one 5 oz glass of wine (148 mL), or one 1 oz glass of hard liquor (44 mL). Do not use any products that contain nicotine or tobacco. These products include cigarettes, chewing tobacco, and vaping devices, such as e-cigarettes. If you need help quitting, ask your health care provider. Activity  Follow a regular exercise program to stay fit. This will help you maintain your balance. Ask your health care provider what types of exercise are appropriate for you. If you need a cane or walker, use it as recommended by your health care provider. Wear supportive shoes that have nonskid soles. Safety  Remove any tripping hazards, such as rugs, cords, and clutter. Install safety equipment such as grab bars in bathrooms and safety rails on stairs. Keep rooms and walkways  well-lit. General instructions Talk with your health care provider about your risks for falling. Tell your health care provider if: You fall. Be sure to tell your health care provider about all falls, even ones that seem minor. You feel dizzy, tiredness (fatigue), or off-balance. Take over-the-counter and prescription medicines only as told by your health care provider. These include  supplements. Eat a healthy diet and maintain a healthy weight. A healthy diet includes low-fat dairy products, low-fat (lean) meats, and fiber from whole grains, beans, and lots of fruits and vegetables. Stay current with your vaccines. Schedule regular health, dental, and eye exams. Summary Having a healthy lifestyle and getting preventive care can help to protect your health and wellness after age 11. Screening and testing are the best way to find a health problem early and help you avoid having a fall. Early diagnosis and treatment give you the best chance for managing medical conditions that are more common for people who are older than age 28. Falls are a major cause of broken bones and head injuries in people who are older than age 48. Take precautions to prevent a fall at home. Work with your health care provider to learn what changes you can make to improve your health and wellness and to prevent falls. This information is not intended to replace advice given to you by your health care provider. Make sure you discuss any questions you have with your health care provider. Document Revised: 04/22/2021 Document Reviewed: 04/22/2021 Elsevier Patient Education  2024 [employer].

## 2024-05-24 NOTE — Progress Notes (Signed)
 Subjective:    Patient ID: Lori Livingston, female    DOB: [birth year], 68 y.o.   MRN: [id number]  Chief Complaint  Patient presents with   Medical Management of Chronic Issues    Fasting no concerns    PT presents to the office today for CPE and chronic follow up. PT is followed by gynecologists every year. Pt is on Estrace after her hysterectomy in 2010 for hot flashes.   Pt is followed by Ortho for bilateral knee pain as needed. She had a right knee replaced in  2019.   She reports she retired May 2022.  Arthritis Presents for follow-up visit. She complains of pain and stiffness. Affected locations include the left knee, right knee, right shoulder, left shoulder, left foot and right foot. Her pain is at a severity of 6/10.  Diabetes She presents for her follow-up diabetic visit. Diabetes type: prediabetic. Pertinent negatives for diabetes include no blurred vision and no foot paresthesias. Risk factors for coronary artery disease include dyslipidemia, diabetes mellitus, hypertension and sedentary lifestyle. She is following a generally healthy diet. (Does not check glucose at home)  Hyperlipidemia This is a chronic problem. The current episode started more than 1 year ago. The problem is controlled. Recent lipid tests were reviewed and are normal. Exacerbating diseases include obesity. Current antihyperlipidemic treatment includes statins. The current treatment provides moderate improvement of lipids. Risk factors for coronary artery disease include dyslipidemia, hypertension and a sedentary lifestyle.  Gastroesophageal Reflux She complains of belching and heartburn. This is a chronic problem. The current episode started more than 1 year ago. The problem occurs occasionally. She has tried a PPI for the symptoms. The treatment provided moderate relief.      Review of Systems  Eyes:  Negative for blurred vision.  Gastrointestinal:  Positive for heartburn.  Musculoskeletal:  Positive  for stiffness.  All other systems reviewed and are negative.  Family History  Problem Relation Age of Onset   Heart disease Mother    Diabetes Mother    Heart attack Mother 41   Stroke Father    Cancer Brother        melanoma   Social History   Socioeconomic History   Marital status: Married    Spouse name: Not on file   Number of children: Not on file   Years of education: Not on file   Highest education level: Not on file  Occupational History   Not on file  Tobacco Use   Smoking status: Former    Current packs/day: 0.00    Types: Cigarettes    Quit date: 07/06/1978    Years since quitting: 45.9   Smokeless tobacco: Never  Vaping Use   Vaping status: Never Used  Substance and Sexual Activity   Alcohol use: No   Drug use: No   Sexual activity: Not on file  Other Topics Concern   Not on file  Social History Narrative   Not on file   Social Drivers of Health   Financial Resource Strain: Low Risk  (08/07/2023)   Overall Financial Resource Strain (CARDIA)    Difficulty of Paying Living Expenses: Not hard at all  Food Insecurity: No Food Insecurity (08/07/2023)   Hunger Vital Sign    Worried About Running Out of Food in the Last Year: Never true    Ran Out of Food in the Last Year: Never true  Transportation Needs: No Transportation Needs (08/07/2023)   PRAPARE - Transportation    Lack  of Transportation (Medical): No    Lack of Transportation (Non-Medical): No  Physical Activity: Insufficiently Active (08/07/2023)   Exercise Vital Sign    Days of Exercise per Week: 2 days    Minutes of Exercise per Session: 20 min  Stress: No Stress Concern Present (08/07/2023)   [employer] of Occupational Health - Occupational Stress Questionnaire    Feeling of Stress : Not at all  Social Connections: Socially Integrated (08/07/2023)   Social Connection and Isolation Panel [NHANES]    Frequency of Communication with Friends and Family: More than three times a week     Frequency of Social Gatherings with Friends and Family: More than three times a week    Attends Religious Services: More than 4 times per year    Active Member of [employer] or Organizations: Yes    Attends Engineer, structural: More than 4 times per year    Marital Status: Married       Objective:   Physical Exam Vitals reviewed.  Constitutional:      General: She is not in acute distress.    Appearance: She is well-developed. She is obese.  HENT:     Head: Normocephalic and atraumatic.     Right Ear: Tympanic membrane normal.     Left Ear: Tympanic membrane normal.  Eyes:     Pupils: Pupils are equal, round, and reactive to light.  Neck:     Thyroid : No thyromegaly.  Cardiovascular:     Rate and Rhythm: Normal rate and regular rhythm.     Heart sounds: Normal heart sounds. No murmur heard. Pulmonary:     Effort: Pulmonary effort is normal. No respiratory distress.     Breath sounds: Normal breath sounds. No wheezing.  Abdominal:     General: Bowel sounds are normal. There is no distension.     Palpations: Abdomen is soft.     Tenderness: There is no abdominal tenderness.  Musculoskeletal:        General: No tenderness. Normal range of motion.     Cervical back: Normal range of motion and neck supple.  Skin:    General: Skin is warm and dry.  Neurological:     Mental Status: She is alert and oriented to person, place, and time.     Cranial Nerves: No cranial nerve deficit.     Deep Tendon Reflexes: Reflexes are normal and symmetric.  Psychiatric:        Behavior: Behavior normal.        Thought Content: Thought content normal.        Judgment: Judgment normal.      BP 112/63   Pulse 65   Temp (!) 97.1 F (36.2 C) (Temporal)   Ht 5\' 7"  (1.702 m)   Wt 192 lb (87.1 kg)   SpO2 98%   BMI 30.07 kg/m      Assessment & Plan:  ADALIE MAND comes in today with chief complaint of Medical Management of Chronic Issues (Fasting no concerns )   Diagnosis and  orders addressed:  1. Hyperlipidemia, unspecified hyperlipidemia type - pravastatin  (PRAVACHOL ) 40 MG tablet; TABLET ONE TABLET AT BEDTIME  Dispense: 90 tablet; Refill: 3 - CMP14+EGFR - CBC with Differential/Platelet - Lipid panel  2. Annual physical exam (Primary) - CMP14+EGFR - CBC with Differential/Platelet - Lipid panel - Bayer DCA Hb A1c Waived  3. Obesity (BMI 30-39.9) - CMP14+EGFR - CBC with Differential/Platelet  4. Prediabetes - CMP14+EGFR - CBC with Differential/Platelet - [employer]  DCA Hb A1c Waived  5. Menopausal syndrome on hormone replacement therapy - CMP14+EGFR - CBC with Differential/Platelet  6. Arthritis of knee, right - CMP14+EGFR - CBC with Differential/Platelet   Labs pending Continue current medications  Health Maintenance reviewed Diet and exercise encouraged  Follow up plan: 6 months    Tommas Fragmin, FNP

## 2024-05-26 ENCOUNTER — Other Ambulatory Visit (HOSPITAL_COMMUNITY): Payer: Self-pay

## 2024-05-26 ENCOUNTER — Telehealth: Payer: Self-pay

## 2024-05-26 ENCOUNTER — Other Ambulatory Visit: Payer: Self-pay | Admitting: Family

## 2024-05-26 ENCOUNTER — Ambulatory Visit: Payer: Self-pay | Admitting: Family

## 2024-05-26 DIAGNOSIS — E1165 Type 2 diabetes mellitus with hyperglycemia: Secondary | ICD-10-CM

## 2024-05-26 MED ORDER — BLOOD GLUCOSE TEST VI STRP: 1.0000 | ORAL_STRIP | Freq: Three times a day (TID) | 0 refills | Status: AC

## 2024-05-26 MED ORDER — LANCETS MISC. MISC
1.0000 | Freq: Three times a day (TID) | 0 refills | Status: AC
Start: 2024-05-26 — End: 2024-06-25

## 2024-05-26 MED ORDER — LANCET DEVICE MISC: 1.0000 | Freq: Three times a day (TID) | 0 refills | Status: AC

## 2024-05-26 MED ORDER — SEMAGLUTIDE(0.25 OR 0.5MG/DOS) 2 MG/3ML ~~LOC~~ SOPN
0.2500 mg | PEN_INJECTOR | SUBCUTANEOUS | 0 refills | Status: DC
Start: 2024-05-26 — End: 2024-05-27

## 2024-05-26 MED ORDER — BLOOD GLUCOSE MONITORING SUPPL DEVI: 1.0000 | Freq: Three times a day (TID) | 0 refills | Status: AC

## 2024-05-26 NOTE — Telephone Encounter (Signed)
 Pharmacy Patient Advocate Encounter   Received notification from Patient Pharmacy that prior authorization for Ozempic is required/requested.   Insurance verification completed.   The patient is insured through [facility] .   Per test claim: PA required; PA submitted to above mentioned insurance via CoverMyMeds Key/confirmation #/EOC [id number] Status is pending

## 2024-05-26 NOTE — Telephone Encounter (Signed)
 I called pt and advised the PA was approved on ozempic but she states the $291.52 copay is too much for her. Please advise.

## 2024-05-26 NOTE — Telephone Encounter (Signed)
 Pharmacy Patient Advocate Encounter  Received notification from [employer] that Prior Authorization for Ozempic 2 has been APPROVED from 05/26/24 to 11/25/24. Ran test claim, Copay is $291.52 due to patient deductible. This test claim was processed through [facility]- copay amounts may vary at other pharmacies due to pharmacy/plan contracts, or as the patient moves through the different stages of their insurance plan.   PA #/Case ID/Reference #: [id number]

## 2024-05-27 ENCOUNTER — Telehealth: Payer: Self-pay

## 2024-05-27 MED ORDER — DAPAGLIFLOZIN PROPANEDIOL 10 MG PO TABS: 10.0000 mg | ORAL_TABLET | Freq: Every day | ORAL | 1 refills | Status: DC

## 2024-05-27 NOTE — Telephone Encounter (Signed)
 Patient picked up ozempic  but can't afford it ever time but will like to stay on it. Concha Deed states she could help. Patient aware

## 2024-05-27 NOTE — Telephone Encounter (Signed)
 Copied from CRM [id number]. Topic: General - Call Back - No Documentation >> May 26, 2024  3:57 PM [employer] wrote: Reason for CRM: Patient would like a call from Tommas Fragmin FNP or her assistant to discuss Ozempic   Callback #: [phone]

## 2024-05-27 NOTE — Addendum Note (Signed)
 Addended by: Tommas Fragmin A on: 05/27/2024 03:46 PM   Modules accepted: Orders

## 2024-05-27 NOTE — Telephone Encounter (Signed)
 Farxiga 10 mg Prescription sent to pharmacy

## 2024-05-27 NOTE — Telephone Encounter (Signed)
 Duplicate call.

## 2024-05-30 ENCOUNTER — Telehealth: Payer: Self-pay

## 2024-05-30 ENCOUNTER — Other Ambulatory Visit (HOSPITAL_COMMUNITY): Payer: Self-pay

## 2024-05-30 NOTE — Progress Notes (Unsigned)
 Care Guide Pharmacy Note  05/30/2024 Name: Lori Livingston MRN: [id number] DOB: December 31, 1955  Referred By: Yevette Hem, FNP Reason for referral: Complex Care Management (Outreach to schedule with Pharm d )   Lori Livingston is a 68 y.o. year old female who is a primary care patient of Yevette Hem, FNP.  Lori Livingston was referred to the pharmacist for assistance related to: DMII  An unsuccessful telephone outreach was attempted today to contact the patient who was referred to the pharmacy team for assistance with medication management. Additional attempts will be made to contact the patient.  Lenton Rail , RMA     [facility]  [facility] Guide  Direct Dial: [phone]  Website: [email or web address]

## 2024-05-31 NOTE — Progress Notes (Signed)
 Care Guide Pharmacy Note  05/31/2024 Name: Lori Livingston MRN: [id number] DOB: 02/01/1956  Referred By: Yevette Hem, FNP Reason for referral: Complex Care Management (Outreach to schedule with Pharm d )   Lori Livingston is a 68 y.o. year old female who is a primary care patient of Yevette Hem, FNP.  Lori Livingston was referred to the pharmacist for assistance related to: DMII  Successful contact was made with the patient to discuss pharmacy services including being ready for the pharmacist to call at least 5 minutes before the scheduled appointment time and to have medication bottles and any blood pressure readings ready for review. The patient agreed to meet with the pharmacist via telephone visit on (date/time).06/28/2024  Lenton Rail , RMA     [place]  [facility], [facility] Guide  Direct Dial: [phone]  Website: [email or web address]

## 2024-06-01 ENCOUNTER — Encounter: Payer: Self-pay | Admitting: Family

## 2024-06-08 DIAGNOSIS — R92333 Mammographic heterogeneous density, bilateral breasts: Secondary | ICD-10-CM | POA: Diagnosis not present

## 2024-06-08 DIAGNOSIS — R928 Other abnormal and inconclusive findings on diagnostic imaging of breast: Secondary | ICD-10-CM | POA: Diagnosis not present

## 2024-06-08 DIAGNOSIS — N6489 Other specified disorders of breast: Secondary | ICD-10-CM | POA: Diagnosis not present

## 2024-06-28 ENCOUNTER — Other Ambulatory Visit (INDEPENDENT_AMBULATORY_CARE_PROVIDER_SITE_OTHER)

## 2024-06-28 ENCOUNTER — Telehealth: Payer: Self-pay | Admitting: Pharmacist

## 2024-06-28 DIAGNOSIS — E119 Type 2 diabetes mellitus without complications: Secondary | ICD-10-CM

## 2024-06-28 DIAGNOSIS — Z7985 Long-term (current) use of injectable non-insulin antidiabetic drugs: Secondary | ICD-10-CM

## 2024-06-28 MED ORDER — ACCU-CHEK GUIDE TEST VI STRP: ORAL_STRIP | 12 refills | Status: AC

## 2024-06-28 MED ORDER — ACCU-CHEK GUIDE TEST VI STRP
ORAL_STRIP | 12 refills | Status: DC
Start: 2024-06-28 — End: 2024-06-28

## 2024-06-28 NOTE — Progress Notes (Signed)
 06/28/2024 Name: Lori Livingston MRN: [id number] DOB: 1956-04-09  Chief Complaint  Patient presents with   Diabetes    Lori Livingston is a 68 y.o. year old female who presented for a telephone visit.  I connected with  Lori Livingston on 06/28/24 by telephone and verified that I am speaking with the correct person using two identifiers. I discussed the limitations of evaluation and management by telemedicine. The patient expressed understanding and agreed to proceed.  Patient was located in her home and PharmD in PCP office during this visit.   They were referred to the pharmacist by their PCP for assistance in managing diabetes.    Subjective:  Care Team: Primary Care Provider: Lavell Bari LABOR, FNP ; Next Scheduled Visit: 08/2024   Medication Access/Adherence  Current Pharmacy:  [facility] [place], [place] - [address] [place] [address] Phone: [phone] Fax: [phone]   Patient reports affordability concerns with their medications: Yes  Patient reports access/transportation concerns to their pharmacy: No  Patient reports adherence concerns with their medications:  Yes  cost   Diabetes:  Current medications: ozempic  0.25mg  weekly Medications tried in the past: n/a Farxiga  removed from medication list--never tried due to cost  Current glucose readings:  FBG before starting ozempic  151 Now FBG 118 on ozempic  0.25mg  Using accuchek guide meter; testing daily  Patient denies hypoglycemic s/sx including dizziness, shakiness, sweating. Patient denies hyperglycemic symptoms including polyuria, polydipsia, polyphagia, nocturia, neuropathy, blurred vision.  Current meal patterns:  Discussed meal planning options and Plate method for healthy eating Avoid sugary drinks and desserts Incorporate balanced protein, non starchy veggies, 1 serving of carbohydrate with each meal Increase water intake Increase physical activity as  able  Current physical activity: encouraged as able  Current medication access support: will enroll in novo nordisk   Objective:  Lab Results  Component Value Date   HGBA1C 8.3 (H) 05/24/2024    Lab Results  Component Value Date   CREATININE 0.67 05/24/2024   BUN 16 05/24/2024   NA 141 05/24/2024   K 3.7 05/24/2024   CL 102 05/24/2024   CO2 21 05/24/2024    Lab Results  Component Value Date   CHOL 174 05/24/2024   HDL 42 05/24/2024   LDLCALC 93 05/24/2024   TRIG 228 (H) 05/24/2024   CHOLHDL 4.1 05/24/2024    Medications Reviewed Today     Reviewed by Billee Mliss BIRCH, [facility] (Pharmacist) on 06/28/24 at 0931  Med List Status: <None>   Medication Order Taking? Sig Documenting Provider Last Dose Status Informant  Blood Glucose Monitoring Suppl DEVI [id number]  1 each by Does not apply route in the morning, at noon, and at bedtime. May substitute to any manufacturer covered by patient's insurance. Lavell Bari A, FNP  Active   Cetirizine HCl (ZYRTEC ALLERGY PO) [id number]  Take by mouth. [provider]  Active   clobetasol  cream (TEMOVATE ) 0.05 % [id number]  Apply 1 application topically 2 (two) times daily. Lavell Bari A, FNP  Active   Coenzyme Q10 200 MG capsule [id number]  Take 200 mg by mouth daily. [provider]  Active   dapagliflozin  propanediol (FARXIGA ) 10 MG TABS tablet [id number]  Take 1 tablet (10 mg total) by mouth daily before breakfast. Lavell Bari A, FNP  Active   Ergocalciferol  (VITAMIN D2) 2000 UNITS TABS [id number]  Take 2,000 Int'l Units by mouth daily. [provider]  Active  estradiol (ESTRACE) 0.5 MG tablet [id number]   [provider]  Active   fluticasone  (FLONASE ) 50 MCG/ACT nasal spray [id number]  Place 2 sprays into both nostrils daily. Cathlene Marry Lenis, FNP  Active   loratadine  (CLARITIN ) 10 MG tablet [id number]  Take 1 tablet (10 mg total) by mouth daily. Gladis, Mary-Margaret, FNP  Active   meloxicam   (MOBIC ) 15 MG tablet [id number]  Take 1 tablet (15 mg total) by mouth daily. Lavell Bari LABOR, FNP  Active   Multiple Vitamin (MULTIVITAMIN) tablet [id number]  Take 1 tablet by mouth daily. [provider]  Active   omeprazole (PRILOSEC) 40 MG capsule [id number]  40 mg. [provider]  Active   pravastatin  (PRAVACHOL ) 40 MG tablet [id number]  TABLET ONE TABLET AT BEDTIME Lavell Bari A, FNP  Active               Assessment/Plan:   Diabetes: - Currently uncontrolled, but much improved after start of Ozempic  0.25mg    Continue current therapy 0.25mg  weekly (sample provided)  Will pursue enrollment Novo nordisk patient assistance program   May increase ozempic  to 0.5mg  weekly in the future as needed based on glycemic control  Extensive counseling provided on GLP1--increased water intake, incorporating adequate protein/fibre/meals intake, management of constipation--please reach out to PCP if concerns arise - Reviewed long term cardiovascular and renal outcomes of uncontrolled blood sugar - Reviewed goal A1c, goal fasting, and goal 2 hour post prandial glucose - Reviewed dietary modifications including FOLLOWING A HEART HEALTHY DIET/HEALTHY PLATE METHOD - Patient denies personal or family history of multiple endocrine neoplasia type 2, medullary thyroid  cancer; personal history of pancreatitis or gallbladder disease. - Recommend to check glucose daily (fasting) or if symptomatic - Meets financial criteria for Ozempic  patient assistance program through novo nordisk. Will collaborate with provider, CPhT, and patient to pursue assistance.     Follow Up Plan: PCP in 08/2024  Mliss Tarry Griffin, PharmD, BCACP, CPP Clinical Pharmacist, [facility]

## 2024-06-28 NOTE — Telephone Encounter (Signed)
 Please route me entire Novo PAP for new Ozempic  0.5mg  weekly PAP

## 2024-06-29 ENCOUNTER — Telehealth: Payer: Self-pay

## 2024-06-29 ENCOUNTER — Other Ambulatory Visit (HOSPITAL_COMMUNITY): Payer: Self-pay

## 2024-06-29 DIAGNOSIS — Z7985 Long-term (current) use of injectable non-insulin antidiabetic drugs: Secondary | ICD-10-CM

## 2024-06-29 NOTE — Progress Notes (Unsigned)
 Pharmacy Medication Assistance Program Note    06/29/2024  Patient ID: Lori Livingston, female   DOB: [birth year], 68 y.o.   MRN: [id number]     06/29/2024  Outreach Medication One  Manufacturer Medication One Novo Nordisk  Nordisk Drugs Ozempic   Type of Radiographer, therapeutic Assistance  Date Application Submitted to [employer] 06/29/2024  Method Application Sent to [employer]     New - submitted online. Provider pages submitted by Mliss 06/30/24

## 2024-06-30 MED ORDER — OZEMPIC (0.25 OR 0.5 MG/DOSE) 2 MG/3ML ~~LOC~~ SOPN: 0.2500 mg | PEN_INJECTOR | SUBCUTANEOUS | Status: DC

## 2024-07-04 NOTE — Progress Notes (Signed)
 Pharmacy Medication Assistance Program Note    07/04/2024  Patient ID: Lori Livingston, female   DOB: 12-30-1955, 68 y.o.   MRN: [id number]     06/29/2024  Outreach Medication One  Manufacturer Medication One Novo Nordisk  Nordisk Drugs Ozempic   Dose of Ozempic  0.5MG   Type of Sport and exercise psychologist  Date Application Submitted to Manufacturer 06/29/2024  Method Application Sent to Manufacturer Online  Patient Assistance Determination Approved  Approval Start Date 07/01/2024  Approval End Date 12/14/2024     NEW - APPROVED

## 2024-07-21 NOTE — Telephone Encounter (Signed)
 Pt called to check on status of medication here at the office. I checked the no medication as of yet. Please call when it arrives.

## 2024-07-27 DIAGNOSIS — H2513 Age-related nuclear cataract, bilateral: Secondary | ICD-10-CM | POA: Diagnosis not present

## 2024-07-27 DIAGNOSIS — H40013 Open angle with borderline findings, low risk, bilateral: Secondary | ICD-10-CM | POA: Diagnosis not present

## 2024-07-27 DIAGNOSIS — E119 Type 2 diabetes mellitus without complications: Secondary | ICD-10-CM | POA: Diagnosis not present

## 2024-07-27 DIAGNOSIS — Z7984 Long term (current) use of oral hypoglycemic drugs: Secondary | ICD-10-CM | POA: Diagnosis not present

## 2024-07-27 LAB — HM DIABETES EYE EXAM

## 2024-08-04 ENCOUNTER — Telehealth: Payer: Self-pay

## 2024-08-04 NOTE — Telephone Encounter (Signed)
 Copied from CRM [id number]. Topic: Clinical - Medication Question >> Aug 03, 2024  3:55 PM Larissa S wrote: Reason for CRM: Patient calling to check if her Ozempic  has arrived to clinic. Contacted CAL, was advised medication has not yet arrived. Patient requesting a call and states she is on her last week of medication.

## 2024-08-05 NOTE — Telephone Encounter (Signed)
 Mliss have you seen this?

## 2024-08-05 NOTE — Telephone Encounter (Signed)
 Per novo nordisk: Arrived 08/05/24 at office

## 2024-08-09 ENCOUNTER — Ambulatory Visit (INDEPENDENT_AMBULATORY_CARE_PROVIDER_SITE_OTHER)

## 2024-08-09 ENCOUNTER — Encounter: Payer: Self-pay | Admitting: Pharmacist

## 2024-08-09 VITALS — BP 112/63 | HR 65 | Ht 67.0 in | Wt 192.0 lb

## 2024-08-09 DIAGNOSIS — Z0000 Encounter for general adult medical examination without abnormal findings: Secondary | ICD-10-CM | POA: Diagnosis not present

## 2024-08-09 NOTE — Progress Notes (Signed)
 Subjective:   Lori Livingston is a 68 y.o. who presents for a Medicare Wellness preventive visit.  As a reminder, Annual Wellness Visits don't include a physical exam, and some assessments may be limited, especially if this visit is performed virtually. We may recommend an in-person follow-up visit with your provider if needed.  Visit Complete: Virtual I connected with  Lori Livingston on 08/09/24 by a audio enabled telemedicine application and verified that I am speaking with the correct person using two identifiers.  Patient Location: Home  Provider Location: Home Office  I discussed the limitations of evaluation and management by telemedicine. The patient expressed understanding and agreed to proceed.  Vital Signs: Because this visit was a virtual/telehealth visit, some criteria may be missing or patient reported. Any vitals not documented were not able to be obtained and vitals that have been documented are patient reported.  VideoDeclined- This patient declined Librarian, academic. Therefore the visit was completed with audio only.  Persons Participating in Visit: Patient.  AWV Questionnaire: No: Patient Medicare AWV questionnaire was not completed prior to this visit.  Cardiac Risk Factors include: advanced age (>27men, >108 women);diabetes mellitus;obesity (BMI >30kg/m2);smoking/ tobacco exposure     Objective:    Today's Vitals   08/09/24 1354  BP: 112/63  Pulse: 65  Weight: 192 lb (87.1 kg)  Height: 5' 7 (1.702 m)   Body mass index is 30.07 kg/m.     08/09/2024    1:59 PM 08/07/2023    8:08 AM  Advanced Directives  Does Patient Have a Medical Advance Directive? No No  Would patient like information on creating a medical advance directive?  Yes (MAU/Ambulatory/Procedural Areas - Information given)    Current Medications (verified) Outpatient Encounter Medications as of 08/09/2024  Medication Sig   Blood Glucose Monitoring Suppl DEVI  1 each by Does not apply route in the morning, at noon, and at bedtime. May substitute to any manufacturer covered by patient's insurance.   Cetirizine HCl (ZYRTEC ALLERGY PO) Take by mouth.   clobetasol  cream (TEMOVATE ) 0.05 % Apply 1 application topically 2 (two) times daily.   Coenzyme Q10 200 MG capsule Take 200 mg by mouth daily.   Ergocalciferol  (VITAMIN D2) 2000 UNITS TABS Take 2,000 Int'l Units by mouth daily.   estradiol (ESTRACE) 0.5 MG tablet    fluticasone  (FLONASE ) 50 MCG/ACT nasal spray Place 2 sprays into both nostrils daily.   glucose blood (ACCU-CHEK GUIDE TEST) test strip test blood sugar daily DX E11.65   loratadine  (CLARITIN ) 10 MG tablet Take 1 tablet (10 mg total) by mouth daily.   meloxicam  (MOBIC ) 15 MG tablet Take 1 tablet (15 mg total) by mouth daily.   Multiple Vitamin (MULTIVITAMIN) tablet Take 1 tablet by mouth daily.   omeprazole (PRILOSEC) 40 MG capsule 40 mg.   pravastatin  (PRAVACHOL ) 40 MG tablet TABLET ONE TABLET AT BEDTIME   Semaglutide ,0.25 or 0.5MG /DOS, (OZEMPIC , 0.25 OR 0.5 MG/DOSE,) 2 MG/3ML SOPN Inject 0.25 mg into the skin once a week. Via novo nordisk PAP   No facility-administered encounter medications on file as of 08/09/2024.    Allergies (verified) Sulfamethoxazole-trimethoprim, Celebrex [celecoxib], Indomethacin, Propoxyphene n-acetaminophen, Nickel, and Propoxyphene   History: Past Medical History:  Diagnosis Date   Arthritis    Right knee   GERD (gastroesophageal reflux disease)    Hyperlipidemia    Neuromuscular disorder (HCC)    Right knee meniscus tear   Vitamin D  deficiency    Warts  Past Surgical History:  Procedure Laterality Date   APPENDECTOMY  02/1988   CHOLECYSTECTOMY  02/1988   SHOULDER SURGERY Right    TONSILLECTOMY  1964   Family History  Problem Relation Age of Onset   Heart disease Mother    Diabetes Mother    Heart attack Mother 72   Stroke Father    Cancer Brother        melanoma   Social History    Socioeconomic History   Marital status: Married    Spouse name: Not on file   Number of children: Not on file   Years of education: Not on file   Highest education level: Not on file  Occupational History   Not on file  Tobacco Use   Smoking status: Former    Current packs/day: 0.00    Types: Cigarettes    Quit date: 07/06/1978    Years since quitting: 46.1   Smokeless tobacco: Never  Vaping Use   Vaping status: Never Used  Substance and Sexual Activity   Alcohol use: No   Drug use: No   Sexual activity: Not on file  Other Topics Concern   Not on file  Social History Narrative   Not on file   Social Drivers of Health   Financial Resource Strain: Low Risk  (08/09/2024)   Overall Financial Resource Strain (CARDIA)    Difficulty of Paying Living Expenses: Not hard at all  Food Insecurity: No Food Insecurity (08/09/2024)   Hunger Vital Sign    Worried About Running Out of Food in the Last Year: Never true    Ran Out of Food in the Last Year: Never true  Transportation Needs: No Transportation Needs (08/09/2024)   PRAPARE - Administrator, Civil Service (Medical): No    Lack of Transportation (Non-Medical): No  Physical Activity: Insufficiently Active (08/09/2024)   Exercise Vital Sign    Days of Exercise per Week: 3 days    Minutes of Exercise per Session: 30 min  Stress: No Stress Concern Present (08/09/2024)   [employer] of Occupational Health - Occupational Stress Questionnaire    Feeling of Stress: Not at all  Social Connections: Socially Integrated (08/09/2024)   Social Connection and Isolation Panel    Frequency of Communication with Friends and Family: More than three times a week    Frequency of Social Gatherings with Friends and Family: More than three times a week    Attends Religious Services: More than 4 times per year    Active Member of [employer] or Organizations: Yes    Attends Banker Meetings: 1 to 4 times per year    Marital  Status: Married    Tobacco Counseling Counseling given: Yes    Clinical Intake:  Pre-visit preparation completed: Yes  Pain : No/denies pain     BMI - recorded: 30.07 Nutritional Status: BMI > 30  Obese Nutritional Risks: None Diabetes: Yes  Lab Results  Component Value Date   HGBA1C 8.3 (H) 05/24/2024   HGBA1C 7.0 (H) 11/23/2023   HGBA1C 6.3 (H) 05/15/2023     How often do you need to have someone help you when you read instructions, pamphlets, or other written materials from your doctor or pharmacy?: 1 - Never  Interpreter Needed?: No  Information entered by :: alia t/cma   Activities of Daily Living     08/09/2024    1:57 PM  In your present state of health, do you have any difficulty performing  the following activities:  Hearing? 0  Vision? 0  Difficulty concentrating or making decisions? 0  Walking or climbing stairs? 0  Dressing or bathing? 0  Doing errands, shopping? 0  Preparing Food and eating ? N  Using the Toilet? N  In the past six months, have you accidently leaked urine? N  Do you have problems with loss of bowel control? N  Managing your Medications? N  Managing your Finances? N  Housekeeping or managing your Housekeeping? N    Patient Care Team: Lavell Bari LABOR, FNP as PCP - General (Nurse Practitioner) Vicci Elon SQUIBB, MD as Referring Physician (Student)  I have updated your Care Teams any recent Medical Services you may have received from other providers in the past year.     Assessment:   This is a routine wellness examination for [place].  Hearing/Vision screen Hearing Screening - Comments:: Pt denies hearing dif Vision Screening - Comments:: Pt wear glasses/pt goes to [facility] in [place]/last ov 2025   Goals Addressed             This Visit's Progress    Exercise 3x per week (30 min per time)   On track      Depression Screen     08/09/2024    2:03 PM 05/24/2024    8:07 AM 03/30/2024    2:24 PM  11/23/2023    8:19 AM 09/18/2023   10:01 AM 08/07/2023    8:07 AM 05/15/2023    8:02 AM  PHQ 2/9 Scores  PHQ - 2 Score 0 0 0 0  0 0  PHQ- 9 Score    0   0  Exception Documentation     Patient refusal      Fall Risk     08/09/2024    1:56 PM 03/30/2024    2:24 PM 11/23/2023    8:18 AM 08/07/2023    8:05 AM 08/05/2023    2:17 PM  Fall Risk   Falls in the past year? 0 0 0 0 0  Number falls in past yr: 0 0 0 0 0  Injury with Fall? 0 0 0 0   Risk for fall due to : No Fall Risks No Fall Risks No Fall Risks No Fall Risks   Follow up Falls evaluation completed Falls evaluation completed Falls evaluation completed;Education provided Falls prevention discussed     MEDICARE RISK AT HOME:  Medicare Risk at Home Any stairs in or around the home?: Yes If so, are there any without handrails?: Yes Home free of loose throw rugs in walkways, pet beds, electrical cords, etc?: Yes Adequate lighting in your home to reduce risk of falls?: Yes Life alert?: No Use of a cane, walker or w/c?: No Grab bars in the bathroom?: Yes Shower chair or bench in shower?: No Elevated toilet seat or a handicapped toilet?: Yes  TIMED UP AND GO:  Was the test performed?  no  Cognitive Function: 6CIT completed        08/09/2024    2:00 PM 08/07/2023    8:08 AM  6CIT Screen  What Year? 0 points 0 points  What month? 0 points 0 points  What time? 0 points 0 points  Count back from 20 0 points 0 points  Months in reverse 0 points 0 points  Repeat phrase 0 points 0 points  Total Score 0 points 0 points    Immunizations Immunization History  Administered Date(s) Administered   Fluad Quad(high  Dose 65+) 10/09/2021, 11/10/2022   INFLUENZA, HIGH DOSE SEASONAL PF 10/09/2021   Influenza Inj Mdck Quad With Preservative 09/24/2020   Influenza Split 09/24/2010, 10/04/2013   Influenza Whole 09/28/2012   Influenza,inj,Quad PF,6+ Mos 10/22/2019   Influenza,inj,Quad PF,6-35 Mos 10/22/2019   Influenza-Unspecified  10/04/2013, 10/05/2014, 09/25/2015, 09/23/2016, 09/17/2017, 09/09/2018, 10/22/2019, 09/24/2020   Moderna Sars-Covid-2 Vaccination 04/30/2020, 05/28/2020   Tdap 02/03/2017, 07/16/2022   Zoster, Live 07/26/2013, 09/28/2013    Screening Tests Health Maintenance  Topic Date Due   FOOT EXAM  Never done   Diabetic kidney evaluation - Urine ACR  Never done   COVID-19 Vaccine (3 - Moderna risk series) 06/25/2020   MAMMOGRAM  06/07/2024   INFLUENZA VACCINE  07/15/2024   Zoster Vaccines- Shingrix (1 of 2) 08/24/2024 (Originally 03/09/1975)   Pneumococcal Vaccine: 50+ Years (1 of 2 - PCV) 11/22/2024 (Originally 03/09/1975)   HEMOGLOBIN A1C  11/23/2024   Diabetic kidney evaluation - eGFR measurement  05/24/2025   OPHTHALMOLOGY EXAM  07/27/2025   Medicare Annual Wellness (AWV)  08/09/2025   Colonoscopy  08/05/2026   DTaP/Tdap/Td (3 - Td or Tdap) 07/16/2032   DEXA SCAN  Completed   Hepatitis C Screening  Completed   HPV VACCINES  Aged Out   Meningococcal B Vaccine  Aged Out    Health Maintenance  Health Maintenance Due  Topic Date Due   FOOT EXAM  Never done   Diabetic kidney evaluation - Urine ACR  Never done   COVID-19 Vaccine (3 - Moderna risk series) 06/25/2020   MAMMOGRAM  06/07/2024   INFLUENZA VACCINE  07/15/2024   Health Maintenance Items Addressed: See Nurse Notes at the end of this note  Additional Screening:  Vision Screening: Recommended annual ophthalmology exams for early detection of glaucoma and other disorders of the eye. Would you like a referral to an eye doctor? No    Dental Screening: Recommended annual dental exams for proper oral hygiene  Community Resource Referral / Chronic Care Management: CRR required this visit?  No   CCM required this visit?  No   Plan:    I have personally reviewed and noted the following in the patient's chart:   Medical and social history Use of alcohol, tobacco or illicit drugs  Current medications and supplements  including opioid prescriptions. Patient is not currently taking opioid prescriptions. Functional ability and status Nutritional status Physical activity Advanced directives List of other physicians Hospitalizations, surgeries, and ER visits in previous 12 months Vitals Screenings to include cognitive, depression, and falls Referrals and appointments  In addition, I have reviewed and discussed with patient certain preventive protocols, quality metrics, and best practice recommendations. A written personalized care plan for preventive services as well as general preventive health recommendations were provided to patient.   Ozie Ned, CMA   08/09/2024   After Visit Summary: (MyChart) Due to this being a telephonic visit, the after visit summary with patients personalized plan was offered to patient via MyChart   Notes: PCP Follow Up Recommendations: Pt is aware and due the following: UrineACR/flu vaccine, Foot exam and Mammogram-already done in 6/25--will need to do US  next per pt

## 2024-08-09 NOTE — Patient Instructions (Signed)
 Lori Livingston , Thank you for taking time out of your busy schedule to complete your Annual Wellness Visit with me. I enjoyed our conversation and look forward to speaking with you again next year. I, as well as your care team,  appreciate your ongoing commitment to your health goals. Please review the following plan we discussed and let me know if I can assist you in the future. Your Game plan/ To Do List    Referrals: If you haven't heard from the office you've been referred to, please reach out to them at the phone provided.   Follow up Visits: We will see or speak with you next year for your Next Medicare AWV with our clinical staff on 08/10/25 1:10p.m.   Have you seen your provider in the last 6 months (3 months if uncontrolled diabetes)? Yes  Clinician Recommendations:  Aim for 30 minutes of exercise or brisk walking, 6-8 glasses of water, and 5 servings of fruits and vegetables each day.       This is a list of the screenings recommended for you:  Health Maintenance  Topic Date Due   Complete foot exam   Never done   Yearly kidney health urinalysis for diabetes  Never done   COVID-19 Vaccine (3 - Moderna risk series) 06/25/2020   Mammogram  06/07/2024   Medicare Annual Wellness Visit  08/06/2024   Flu Shot  07/15/2024   Zoster (Shingles) Vaccine (1 of 2) 08/24/2024*   Pneumococcal Vaccine for age over 50 (1 of 2 - PCV) 11/22/2024*   Hemoglobin A1C  11/23/2024   Yearly kidney function blood test for diabetes  05/24/2025   Eye exam for diabetics  07/27/2025   Colon Cancer Screening  08/05/2026   DTaP/Tdap/Td vaccine (3 - Td or Tdap) 07/16/2032   DEXA scan (bone density measurement)  Completed   Hepatitis C Screening  Completed   HPV Vaccine  Aged Out   Meningitis B Vaccine  Aged Out  *Topic was postponed. The date shown is not the original due date.    Advanced directives: (Declined) Advance directive discussed with you today. Even though you declined this today, please call our  office should you change your mind, and we can give you the proper paperwork for you to fill out. Advance Care Planning is important because it:  [x]  Makes sure you receive the medical care that is consistent with your values, goals, and preferences  [x]  It provides guidance to your family and loved ones and reduces their decisional burden about whether or not they are making the right decisions based on your wishes.  Follow the link provided in your after visit summary or read over the paperwork we have mailed to you to help you started getting your Advance Directives in place. If you need assistance in completing these, please reach out to us  so that we can help you!  See attachments for Preventive Care and Fall Prevention Tips.

## 2024-08-22 ENCOUNTER — Encounter: Payer: Self-pay | Admitting: Family

## 2024-08-22 ENCOUNTER — Ambulatory Visit (INDEPENDENT_AMBULATORY_CARE_PROVIDER_SITE_OTHER): Admitting: Family

## 2024-08-22 VITALS — BP 133/76 | HR 69 | Temp 97.1°F | Ht 67.0 in | Wt 189.8 lb

## 2024-08-22 DIAGNOSIS — E559 Vitamin D deficiency, unspecified: Secondary | ICD-10-CM

## 2024-08-22 DIAGNOSIS — M1711 Unilateral primary osteoarthritis, right knee: Secondary | ICD-10-CM | POA: Diagnosis not present

## 2024-08-22 DIAGNOSIS — E669 Obesity, unspecified: Secondary | ICD-10-CM | POA: Diagnosis not present

## 2024-08-22 DIAGNOSIS — E785 Hyperlipidemia, unspecified: Secondary | ICD-10-CM | POA: Diagnosis not present

## 2024-08-22 DIAGNOSIS — E119 Type 2 diabetes mellitus without complications: Secondary | ICD-10-CM | POA: Diagnosis not present

## 2024-08-22 DIAGNOSIS — Z7985 Long-term (current) use of injectable non-insulin antidiabetic drugs: Secondary | ICD-10-CM

## 2024-08-22 DIAGNOSIS — E1165 Type 2 diabetes mellitus with hyperglycemia: Secondary | ICD-10-CM | POA: Diagnosis not present

## 2024-08-22 LAB — CMP14+EGFR
ALT: 22 IU/L (ref 0–32)
AST: 22 IU/L (ref 0–40)
Albumin: 4.5 g/dL (ref 3.9–4.9)
Alkaline Phosphatase: 86 IU/L (ref 44–121)
BUN/Creatinine Ratio: 23 (ref 12–28)
BUN: 15 mg/dL (ref 8–27)
Bilirubin Total: 0.9 mg/dL (ref 0.0–1.2)
CO2: 21 mmol/L (ref 20–29)
Calcium: 9.8 mg/dL (ref 8.7–10.3)
Chloride: 103 mmol/L (ref 96–106)
Creatinine, Ser: 0.65 mg/dL (ref 0.57–1.00)
Globulin, Total: 2.5 g/dL (ref 1.5–4.5)
Glucose: 117 mg/dL — ABNORMAL HIGH (ref 70–99)
Potassium: 4 mmol/L (ref 3.5–5.2)
Sodium: 141 mmol/L (ref 134–144)
Total Protein: 7 g/dL (ref 6.0–8.5)
eGFR: 96 mL/min/1.73 (ref >59)

## 2024-08-22 LAB — BAYER DCA HB A1C WAIVED: HB A1C (BAYER DCA - WAIVED): 6.1 % — ABNORMAL HIGH (ref 4.8–5.6)

## 2024-08-22 MED ORDER — OZEMPIC (0.25 OR 0.5 MG/DOSE) 2 MG/3ML ~~LOC~~ SOPN: 0.5000 mg | PEN_INJECTOR | SUBCUTANEOUS | Status: DC

## 2024-08-22 NOTE — Progress Notes (Signed)
 Subjective:    Patient ID: Lori Livingston, female    DOB: [birth year], 68 y.o.   MRN: [id number]  Chief Complaint  Patient presents with   3 MONTH FOLLOW UP   PT presents to the office today for chronic follow up.   PT is followed by gynecologists every two years. Pt is on Estrace after her hysterectomy in 2010 for hot flashes.   Pt is followed by Ortho for bilateral knee pain as needed. She had a right knee replaced in  2019.   She reports she retired May 2022.   She is a diabetic and started on Ozempic . Her starting weight is 192 lb.      08/22/2024    8:31 AM 08/09/2024    1:54 PM 05/24/2024    8:05 AM  Last 3 Weights  Weight (lbs) 189 lb 12.8 oz 192 lb 192 lb  Weight (kg) 86.093 kg 87.091 kg 87.091 kg     Arthritis Presents for follow-up visit. She complains of pain and stiffness. Affected locations include the left knee, right knee, right shoulder, left shoulder, left foot and right foot. Her pain is at a severity of 6/10.  Diabetes She presents for her follow-up diabetic visit. She has type 2 diabetes mellitus. Pertinent negatives for diabetes include no blurred vision and no foot paresthesias. Risk factors for coronary artery disease include dyslipidemia, diabetes mellitus, hypertension and sedentary lifestyle. She is following a generally healthy diet. Her overall blood glucose range is 110-130 mg/dl.  Hyperlipidemia This is a chronic problem. The current episode started more than 1 year ago. The problem is controlled. Recent lipid tests were reviewed and are normal. Exacerbating diseases include obesity. Current antihyperlipidemic treatment includes statins. The current treatment provides moderate improvement of lipids. Risk factors for coronary artery disease include dyslipidemia, hypertension and a sedentary lifestyle.  Gastroesophageal Reflux She complains of belching and heartburn. This is a chronic problem. The current episode started more than 1 year ago. The problem  occurs occasionally. The symptoms are aggravated by certain foods. She has tried a PPI for the symptoms. The treatment provided moderate relief.      Review of Systems  Eyes:  Negative for blurred vision.  Gastrointestinal:  Positive for heartburn.  Musculoskeletal:  Positive for stiffness.  All other systems reviewed and are negative.  Family History  Problem Relation Age of Onset   Heart disease Mother    Diabetes Mother    Heart attack Mother 20   Stroke Father    Cancer Brother        melanoma   Social History   Socioeconomic History   Marital status: Married    Spouse name: Not on file   Number of children: Not on file   Years of education: Not on file   Highest education level: Not on file  Occupational History   Not on file  Tobacco Use   Smoking status: Former    Current packs/day: 0.00    Types: Cigarettes    Quit date: 07/06/1978    Years since quitting: 46.1   Smokeless tobacco: Never  Vaping Use   Vaping status: Never Used  Substance and Sexual Activity   Alcohol use: No   Drug use: No   Sexual activity: Not on file  Other Topics Concern   Not on file  Social History Narrative   Not on file   Social Drivers of Health   Financial Resource Strain: Low Risk  (08/09/2024)   Overall Financial  Resource Strain (CARDIA)    Difficulty of Paying Living Expenses: Not hard at all  Food Insecurity: No Food Insecurity (08/09/2024)   Hunger Vital Sign    Worried About Running Out of Food in the Last Year: Never true    Ran Out of Food in the Last Year: Never true  Transportation Needs: No Transportation Needs (08/09/2024)   PRAPARE - Administrator, Civil Service (Medical): No    Lack of Transportation (Non-Medical): No  Physical Activity: Insufficiently Active (08/09/2024)   Exercise Vital Sign    Days of Exercise per Week: 3 days    Minutes of Exercise per Session: 30 min  Stress: No Stress Concern Present (08/09/2024)   [employer] of  Occupational Health - Occupational Stress Questionnaire    Feeling of Stress: Not at all  Social Connections: Socially Integrated (08/09/2024)   Social Connection and Isolation Panel    Frequency of Communication with Friends and Family: More than three times a week    Frequency of Social Gatherings with Friends and Family: More than three times a week    Attends Religious Services: More than 4 times per year    Active Member of [employer] or Organizations: Yes    Attends Banker Meetings: 1 to 4 times per year    Marital Status: Married       Objective:   Physical Exam Vitals reviewed.  Constitutional:      General: She is not in acute distress.    Appearance: She is well-developed. She is obese.  HENT:     Head: Normocephalic and atraumatic.     Right Ear: Tympanic membrane normal.     Left Ear: Tympanic membrane normal.  Eyes:     Pupils: Pupils are equal, round, and reactive to light.  Neck:     Thyroid : No thyromegaly.  Cardiovascular:     Rate and Rhythm: Normal rate and regular rhythm.     Heart sounds: Normal heart sounds. No murmur heard. Pulmonary:     Effort: Pulmonary effort is normal. No respiratory distress.     Breath sounds: Normal breath sounds. No wheezing.  Abdominal:     General: Bowel sounds are normal. There is no distension.     Palpations: Abdomen is soft.     Tenderness: There is no abdominal tenderness.  Musculoskeletal:        General: No tenderness. Normal range of motion.     Cervical back: Normal range of motion and neck supple.  Skin:    General: Skin is warm and dry.  Neurological:     Mental Status: She is alert and oriented to person, place, and time.     Cranial Nerves: No cranial nerve deficit.     Deep Tendon Reflexes: Reflexes are normal and symmetric.  Psychiatric:        Behavior: Behavior normal.        Thought Content: Thought content normal.        Judgment: Judgment normal.      BP 133/76   Pulse 69   Temp (!)  97.1 F (36.2 C)   Ht 5' 7 (1.702 m)   Wt 189 lb 12.8 oz (86.1 kg)   SpO2 98%   BMI 29.73 kg/m      Assessment & Plan:  Lori Livingston comes in today with chief complaint of 3 MONTH FOLLOW UP   Diagnosis and orders addressed:  1. Type 2 diabetes mellitus with hyperglycemia, without long-term  current use of insulin (HCC) (Primary) - Bayer DCA Hb A1c Waived - CMP14+EGFR - Microalbumin / creatinine urine ratio  2. Obesity (BMI 30-39.9) - CMP14+EGFR  3. Hyperlipidemia, unspecified hyperlipidemia type - CMP14+EGFR  4. Vitamin D  deficiency - CMP14+EGFR  5. Arthritis of knee, right - CMP14+EGFR  6. Diabetes mellitus treated with injections of non-insulin medication (HCC) - Semaglutide ,0.25 or 0.5MG /DOS, (OZEMPIC , 0.25 OR 0.5 MG/DOSE,) 2 MG/3ML SOPN; Inject 0.5 mg into the skin once a week. Via novo nordisk PAP  Will increase Ozempic  to 0.5 mg from 0.25 mg, pt gets this from patient assistance. Will route Clinical Pharmacists  to assist with this.  Strict low carb diet  Labs pending Continue current medications  Health Maintenance reviewed Diet and exercise encouraged  Follow up plan: 3 months    Bari Learn, FNP

## 2024-08-22 NOTE — Patient Instructions (Signed)

## 2024-08-23 ENCOUNTER — Ambulatory Visit: Payer: Self-pay | Admitting: Family

## 2024-08-23 LAB — MICROALBUMIN / CREATININE URINE RATIO
Creatinine, Urine: 146 mg/dL
Microalb/Creat Ratio: 12 mg/g{creat} (ref 0–29)
Microalbumin, Urine: 17.3 ug/mL

## 2024-08-24 DIAGNOSIS — D229 Melanocytic nevi, unspecified: Secondary | ICD-10-CM | POA: Diagnosis not present

## 2024-08-24 DIAGNOSIS — Z85828 Personal history of other malignant neoplasm of skin: Secondary | ICD-10-CM | POA: Diagnosis not present

## 2024-08-24 DIAGNOSIS — L821 Other seborrheic keratosis: Secondary | ICD-10-CM | POA: Diagnosis not present

## 2024-08-24 DIAGNOSIS — L570 Actinic keratosis: Secondary | ICD-10-CM | POA: Diagnosis not present

## 2024-08-24 DIAGNOSIS — L820 Inflamed seborrheic keratosis: Secondary | ICD-10-CM | POA: Diagnosis not present

## 2024-08-24 DIAGNOSIS — L409 Psoriasis, unspecified: Secondary | ICD-10-CM | POA: Diagnosis not present

## 2024-09-28 ENCOUNTER — Telehealth: Payer: Self-pay

## 2024-09-28 ENCOUNTER — Telehealth: Payer: Self-pay | Admitting: Pharmacist

## 2024-09-28 DIAGNOSIS — E1165 Type 2 diabetes mellitus with hyperglycemia: Secondary | ICD-10-CM

## 2024-09-28 NOTE — Telephone Encounter (Signed)
 Called and spoke with patient and gave her this message that Julie gave our office to give patients  The Ozempic /Rybelsus  MEDICARE patient assistance program will be cancelled in 2026.  We are notifying OVER 150 patients of this change and working to find options.  Samples will not be sustainable.  It's open enrollment for Medicare so I'm suggesting patients REALLY look into insurance options that may best fit their needs.  There are no great generic options & no other GLP programs.  You may see some patients will have to go on insulin.  Please forward these patient calls/messages to me or/and [place].    Patient voiced understanding and will work on finding an Financial controller that works best for her and will wait to hear from Julie/Camille for update.    Copied from CRM [id number]. Topic: Clinical - Medication Question >> Sep 28, 2024  2:27 PM Willma R wrote: Reason for CRM: Patient is requesting to speak with Mliss Griffin in regards to her medications and a letter she received about a change in options for the patient assistance.  Patient can be reached at [phone]

## 2024-09-28 NOTE — Telephone Encounter (Signed)
 Patient at goal A1c, however the Ozempic  program will be ending in 11/2024.  [id number] to discuss patient's medications and options.  Please have patient scheduled with PharmD for full medication review and medication access.  Consider Farxiga  PAP.  Do not see previous trial of metformin or intolerance to.  GFR 96   Mliss Tarry Griffin, PharmD, [place]. [place], CPP Clinical Pharmacist, [facility]

## 2024-09-29 ENCOUNTER — Telehealth: Payer: Self-pay

## 2024-09-29 NOTE — Progress Notes (Signed)
 Care Guide Pharmacy Note  09/29/2024 Name: Lori Livingston MRN: [id number] DOB: 06/08/1956  Referred By: Lavell Bari LABOR, FNP Reason for referral: Complex Care Management (Outreach to schedule with Pharm d )   Lori Livingston is a 68 y.o. year old female who is a primary care patient of Lavell Bari LABOR, FNP.  Lori Livingston was referred to the pharmacist for assistance related to: DMII  An unsuccessful telephone outreach was attempted today to contact the patient who was referred to the pharmacy team for assistance with medication assistance. Additional attempts will be made to contact the patient.  Jeoffrey Buffalo , RMA     [facility]  [facility] Guide  Direct Dial: [phone]  Website: [email or web address]

## 2024-09-29 NOTE — Progress Notes (Signed)
 Care Guide Pharmacy Note  09/29/2024 Name: [name] MRN: [id number] DOB: 05-28-1956  Referred By: Lavell Bari LABOR, FNP Reason for referral: Complex Care Management (Outreach to schedule with Pharm d )   Lori Livingston is a 68 y.o. year old female who is a primary care patient of Lavell Bari LABOR, FNP.  [name] was referred to the pharmacist for assistance related to: DMII  Successful contact was made with the patient to discuss pharmacy services including being ready for the pharmacist to call at least 5 minutes before the scheduled appointment time and to have medication bottles and any blood pressure readings ready for review. The patient agreed to meet with the pharmacist via telephone visit on (date/time).10/21/2024  Jeoffrey Buffalo , RMA     [place]  [facility] Guide  Direct Dial: [phone]  Website: [email or web address]

## 2024-10-21 ENCOUNTER — Telehealth: Payer: Self-pay

## 2024-10-21 ENCOUNTER — Other Ambulatory Visit (INDEPENDENT_AMBULATORY_CARE_PROVIDER_SITE_OTHER): Payer: Self-pay

## 2024-10-21 ENCOUNTER — Encounter: Payer: Self-pay | Admitting: Pharmacist

## 2024-10-21 DIAGNOSIS — Z7985 Long-term (current) use of injectable non-insulin antidiabetic drugs: Secondary | ICD-10-CM

## 2024-10-21 DIAGNOSIS — E119 Type 2 diabetes mellitus without complications: Secondary | ICD-10-CM

## 2024-10-21 NOTE — Telephone Encounter (Signed)
   EMAILED OZEMPIC  0.5MG  REFILL FORM TO JULIE P

## 2024-10-21 NOTE — Progress Notes (Signed)
 10/21/2024 Name: Lori Livingston MRN: [id number] DOB: 11-05-1956  Chief Complaint  Patient presents with   Diabetes    Lori Livingston is a 68 y.o. year old female who presented for a telephone visit.  I connected with  Lori Livingston on 10/21/24 by telephone and verified that I am speaking with the correct person using two identifiers. I discussed the limitations of evaluation and management by telemedicine. The patient expressed understanding and agreed to proceed.  Patient was located in her home and PharmD in PCP office during this visit.   They were referred to the pharmacist by their PCP for assistance in managing diabetes.    Subjective:  Care Team: Primary Care Provider: Lavell Bari LABOR, FNP ; Next Scheduled Visit: 08/2024   Medication Access/Adherence  Current Pharmacy:  [facility] [place], [place] - [address] [place] [address] Phone: [phone] Fax: [phone]   Patient reports affordability concerns with their medications: Yes  Patient reports access/transportation concerns to their pharmacy: No  Patient reports adherence concerns with their medications:  Yes  cost   Diabetes:  Current medications: ozempic  0.5mg  weekly Medications tried in the past: n/a Farxiga  removed from medication list--never tried due to cost  Current glucose readings:  FBG before starting ozempic  151 Now FBG 118 on ozempic  0.25mg  Using accuchek guide meter; testing daily  Patient denies hypoglycemic s/sx including dizziness, shakiness, sweating. Patient denies hyperglycemic symptoms including polyuria, polydipsia, polyphagia, nocturia, neuropathy, blurred vision.  Current meal patterns:  Discussed meal planning options and Plate method for healthy eating Avoid sugary drinks and desserts Incorporate balanced protein, non starchy veggies, 1 serving of carbohydrate with each meal Increase water intake Increase physical activity as  able  Current physical activity: encouraged as able  Current medication access support: will enroll in novo nordisk   Objective:  Lab Results  Component Value Date   HGBA1C 6.1 (H) 08/22/2024    Lab Results  Component Value Date   CREATININE 0.65 08/22/2024   BUN 15 08/22/2024   NA 141 08/22/2024   K 4.0 08/22/2024   CL 103 08/22/2024   CO2 21 08/22/2024    Lab Results  Component Value Date   CHOL 174 05/24/2024   HDL 42 05/24/2024   LDLCALC 93 05/24/2024   TRIG 228 (H) 05/24/2024   CHOLHDL 4.1 05/24/2024    Medications Reviewed Today     Reviewed by Billee Mliss BIRCH, [facility] (Pharmacist) on 10/21/24 at [phone]  Med List Status: <None>   Medication Order Taking? Sig Documenting Provider Last Dose Status Informant  Blood Glucose Monitoring Suppl DEVI [id number]  1 each by Does not apply route in the morning, at noon, and at bedtime. May substitute to any manufacturer covered by patient's insurance. Lori Bari A, FNP  Active   Cetirizine HCl (ZYRTEC ALLERGY PO) [id number]  Take by mouth. [provider]  Active   clobetasol  cream (TEMOVATE ) 0.05 % [id number]  Apply 1 application topically 2 (two) times daily. Lori Bari A, FNP  Active   Coenzyme Q10 200 MG capsule [id number]  Take 200 mg by mouth daily. [provider]  Active   Ergocalciferol  (VITAMIN D2) 2000 UNITS TABS [id number]  Take 2,000 Int'l Units by mouth daily. [provider]  Active   estradiol (ESTRACE) 0.5 MG tablet [id number]   [provider]  Active   fluticasone  (FLONASE ) 50 MCG/ACT nasal spray [id number]  Place 2 sprays into both  nostrils daily. Cathlene Marry Lenis, FNP  Active   glucose blood (ACCU-CHEK GUIDE TEST) test strip [id number]  test blood sugar daily DX E11.65 Lori Lye A, FNP  Active   loratadine  (CLARITIN ) 10 MG tablet [id number]  Take 1 tablet (10 mg total) by mouth daily. Gladis, Mary-Margaret, FNP  Active   meloxicam  (MOBIC ) 15 MG tablet [id number]  Take  1 tablet (15 mg total) by mouth daily. Lori Lye LABOR, FNP  Active   Multiple Vitamin (MULTIVITAMIN) tablet [id number]  Take 1 tablet by mouth daily. [provider]  Active   omeprazole (PRILOSEC) 40 MG capsule [id number]  40 mg. [provider]  Active   pravastatin  (PRAVACHOL ) 40 MG tablet [id number]  TABLET ONE TABLET AT BEDTIME Lori Lye A, FNP  Active   Semaglutide ,0.25 or 0.5MG /DOS, (OZEMPIC , 0.25 OR 0.5 MG/DOSE,) 2 MG/3ML SOPN [id number]  Inject 0.5 mg into the skin once a week. Via novo nordisk PAP Hawks, [place] A, FNP  Active              Assessment/Plan:   Diabetes: - Currently controlled, but much improved after initiation of Ozempic , patient stable on 0.5mg  weekly  Continue current therapy 0.5mg  weekly (patient has enough supply to last through 2025)  Extensive counseling provided on GLP1--increased water intake, incorporating adequate protein/fibre/meals intake, management of constipation--please reach out to PCP if concerns arise - Reviewed long term cardiovascular and renal outcomes of uncontrolled blood sugar - Reviewed goal A1c, goal fasting, and goal 2 hour post prandial glucose - Reviewed dietary modifications including FOLLOWING A HEART HEALTHY DIET/HEALTHY PLATE METHOD - Patient denies personal or family history of multiple endocrine neoplasia type 2, medullary thyroid  cancer; personal history of pancreatitis or gallbladder disease. - Recommend to check glucose daily (fasting) or if symptomatic - Novo Ozempic  program cancelled for 2026; patient to discuss insurance plans for 2026 and get back to me     Follow Up Plan: January 2026   Mliss Tarry Griffin, PharmD, BCACP, CPP Clinical Pharmacist, [facility]

## 2024-11-02 NOTE — Telephone Encounter (Signed)
 Faxed completed refill form to [facility]

## 2024-11-11 ENCOUNTER — Other Ambulatory Visit: Payer: Self-pay

## 2024-11-11 ENCOUNTER — Encounter (HOSPITAL_COMMUNITY): Payer: Self-pay | Admitting: *Deleted

## 2024-11-11 ENCOUNTER — Emergency Department (HOSPITAL_COMMUNITY)

## 2024-11-11 ENCOUNTER — Emergency Department (HOSPITAL_COMMUNITY)
Admission: EM | Admit: 2024-11-11 | Discharge: 2024-11-11 | Disposition: A | Discharge status: Home / Self Care | Attending: Emergency Medicine | Admitting: Emergency Medicine

## 2024-11-11 DIAGNOSIS — Z87891 Personal history of nicotine dependence: Secondary | ICD-10-CM | POA: Diagnosis not present

## 2024-11-11 DIAGNOSIS — K859 Acute pancreatitis without necrosis or infection, unspecified: Secondary | ICD-10-CM | POA: Insufficient documentation

## 2024-11-11 DIAGNOSIS — K922 Gastrointestinal hemorrhage, unspecified: Secondary | ICD-10-CM | POA: Diagnosis not present

## 2024-11-11 DIAGNOSIS — E119 Type 2 diabetes mellitus without complications: Secondary | ICD-10-CM | POA: Insufficient documentation

## 2024-11-11 DIAGNOSIS — Z794 Long term (current) use of insulin: Secondary | ICD-10-CM | POA: Insufficient documentation

## 2024-11-11 DIAGNOSIS — R1013 Epigastric pain: Secondary | ICD-10-CM | POA: Diagnosis present

## 2024-11-11 LAB — CBC WITH DIFFERENTIAL/PLATELET
Abs Immature Granulocytes: 0.04 K/uL (ref 0.00–0.07)
Abs Immature Granulocytes: 0.01 K/uL (ref 0.00–0.07)
Basophils Absolute: 0.1 K/uL (ref 0.0–0.1)
Basophils Absolute: 0.1 K/uL (ref 0.0–0.1)
Basophils Relative: 1 %
Basophils Relative: 1 %
Eosinophils Absolute: 0.2 K/uL (ref 0.0–0.5)
Eosinophils Absolute: 0.2 K/uL (ref 0.0–0.5)
Eosinophils Relative: 2 %
Eosinophils Relative: 2 %
HCT: 37 % (ref 36.0–46.0)
HCT: 36.7 % (ref 36.0–46.0)
Hemoglobin: 12.8 g/dL (ref 12.0–15.0)
Hemoglobin: 12.3 g/dL (ref 12.0–15.0)
Immature Granulocytes: 0 %
Immature Granulocytes: 0 %
Lymphocytes Relative: 45 %
Lymphocytes Relative: 42 %
Lymphs Abs: 4.7 K/uL — ABNORMAL HIGH (ref 0.7–4.0)
Lymphs Abs: 3.8 K/uL (ref 0.7–4.0)
MCH: 32.3 pg (ref 26.0–34.0)
MCH: 31.5 pg (ref 26.0–34.0)
MCHC: 34.6 g/dL (ref 30.0–36.0)
MCHC: 33.5 g/dL (ref 30.0–36.0)
MCV: 93.4 fL (ref 80.0–100.0)
MCV: 94.1 fL (ref 80.0–100.0)
Monocytes Absolute: 0.8 K/uL (ref 0.1–1.0)
Monocytes Absolute: 0.7 K/uL (ref 0.1–1.0)
Monocytes Relative: 8 %
Monocytes Relative: 8 %
Neutro Abs: 4.7 K/uL (ref 1.7–7.7)
Neutro Abs: 4.2 K/uL (ref 1.7–7.7)
Neutrophils Relative %: 44 %
Neutrophils Relative %: 47 %
Platelets: 337 K/uL (ref 150–400)
Platelets: 326 K/uL (ref 150–400)
RBC: 3.96 MIL/uL (ref 3.87–5.11)
RBC: 3.9 MIL/uL (ref 3.87–5.11)
RDW: 13.5 % (ref 11.5–15.5)
RDW: 13.7 % (ref 11.5–15.5)
WBC: 10.5 K/uL (ref 4.0–10.5)
WBC: 8.9 K/uL (ref 4.0–10.5)
nRBC: 0 % (ref 0.0–0.2)
nRBC: 0 % (ref 0.0–0.2)

## 2024-11-11 LAB — COMPREHENSIVE METABOLIC PANEL WITH GFR
ALT: 17 U/L (ref 0–44)
AST: 22 U/L (ref 15–41)
Albumin: 4.5 g/dL (ref 3.5–5.0)
Alkaline Phosphatase: 75 U/L (ref 38–126)
Anion gap: 10 (ref 5–15)
BUN: 17 mg/dL (ref 8–23)
CO2: 26 mmol/L (ref 22–32)
Calcium: 9.5 mg/dL (ref 8.9–10.3)
Chloride: 105 mmol/L (ref 98–111)
Creatinine, Ser: 0.66 mg/dL (ref 0.44–1.00)
GFR, Estimated: >60 mL/min (ref >60)
Glucose, Bld: 93 mg/dL (ref 70–99)
Potassium: 4 mmol/L (ref 3.5–5.1)
Sodium: 142 mmol/L (ref 135–145)
Total Bilirubin: 0.4 mg/dL (ref 0.0–1.2)
Total Protein: 7.1 g/dL (ref 6.5–8.1)

## 2024-11-11 LAB — I-STAT CHEM 8, ED
BUN: 19 mg/dL (ref 8–23)
Calcium, Ion: 1.24 mmol/L (ref 1.15–1.40)
Chloride: 103 mmol/L (ref 98–111)
Creatinine, Ser: 0.8 mg/dL (ref 0.44–1.00)
Glucose, Bld: 90 mg/dL (ref 70–99)
HCT: 39 % (ref 36.0–46.0)
Hemoglobin: 13.3 g/dL (ref 12.0–15.0)
Potassium: 3.9 mmol/L (ref 3.5–5.1)
Sodium: 143 mmol/L (ref 135–145)
TCO2: 28 mmol/L (ref 22–32)

## 2024-11-11 LAB — TYPE AND SCREEN
ABO/RH(D): AB POS
Antibody Screen: NEGATIVE

## 2024-11-11 LAB — LIPASE, BLOOD: Lipase: 54 U/L — ABNORMAL HIGH (ref 11–51)

## 2024-11-11 LAB — POC OCCULT BLOOD, ED: Fecal Occult Bld: POSITIVE — AB

## 2024-11-11 MED ORDER — OXYCODONE-ACETAMINOPHEN 5-325 MG PO TABS: 1.0000 | ORAL_TABLET | Freq: Four times a day (QID) | ORAL | 0 refills | Status: DC | PRN

## 2024-11-11 MED ORDER — ACETAMINOPHEN 500 MG PO TABS
1000.0000 mg | ORAL_TABLET | Freq: Once | ORAL | Status: AC
  Administered 2024-11-11: 1000 mg via ORAL
  Filled 2024-11-11: qty 2

## 2024-11-11 MED ORDER — SODIUM CHLORIDE 0.9 % IV BOLUS
1000.0000 mL | Freq: Once | INTRAVENOUS | Status: AC
Start: 2024-11-11 — End: 2024-11-11
  Administered 2024-11-11: 1000 mL via INTRAVENOUS

## 2024-11-11 MED ORDER — ONDANSETRON HCL 4 MG/2ML IJ SOLN
4.0000 mg | Freq: Once | INTRAMUSCULAR | Status: AC
Start: 2024-11-11 — End: 2024-11-11
  Administered 2024-11-11: 4 mg via INTRAVENOUS
  Filled 2024-11-11: qty 2

## 2024-11-11 MED ORDER — HYDROMORPHONE HCL 1 MG/ML IJ SOLN
0.5000 mg | Freq: Once | INTRAMUSCULAR | Status: AC
  Administered 2024-11-11: 0.5 mg via INTRAVENOUS
  Filled 2024-11-11: qty 0.5

## 2024-11-11 MED ORDER — IOHEXOL 300 MG/ML  SOLN
100.0000 mL | Freq: Once | INTRAMUSCULAR | Status: AC | PRN
  Administered 2024-11-11: 100 mL via INTRAVENOUS

## 2024-11-11 NOTE — ED Provider Notes (Signed)
  EMERGENCY DEPARTMENT AT [facility] Provider Note  CSN: [id number] Arrival date & time: 11/11/24 1149  Chief Complaint(s) Abdominal Pain  HPI Lori Livingston is a 68 y.o. female history of diabetes, hyperlipidemia presenting to the emergency department with epigastric pain.  Patient reports epigastric pain for a week.  Reports that she has been having associated nausea, no vomiting.  She denies diarrhea.  Reports that she is having both red blood and dark stools.  No fevers or chills.  No pain in the back.  No chest pain or shortness of breath.  No urinary symptoms.  Reports feeling lightheaded but no syncope or fainting.  Denies any anticoagulant use   Past Medical History Past Medical History:  Diagnosis Date   Arthritis    Right knee   GERD (gastroesophageal reflux disease)    Hyperlipidemia    Neuromuscular disorder (HCC)    Right knee meniscus tear   Vitamin D  deficiency    Warts    Patient Active Problem List   Diagnosis Date Noted   Diabetes mellitus (HCC) 05/05/2022   Obesity (BMI 30-39.9) 06/09/2016   Menopausal syndrome on hormone replacement therapy 11/16/2015   Hyperlipidemia 02/26/2013   Vitamin D  deficiency 02/26/2013   Arthritis of knee, right 02/26/2013   Home Medication(s) Prior to Admission medications   Medication Sig Start Date End Date Taking? Authorizing Provider  Blood Glucose Monitoring Suppl DEVI 1 each by Does not apply route in the morning, at noon, and at bedtime. May substitute to any manufacturer covered by patient's insurance. 05/26/24   Lavell Bari LABOR, FNP  Cetirizine HCl (ZYRTEC ALLERGY PO) Take by mouth.    [provider]  clobetasol  cream (TEMOVATE ) 0.05 % Apply 1 application topically 2 (two) times daily. 01/06/19   Lavell Bari A, FNP  Coenzyme Q10 200 MG capsule Take 200 mg by mouth daily.    [provider]  Ergocalciferol  (VITAMIN D2) 2000 UNITS TABS Take 2,000 Int'l Units by mouth daily.     [provider]  estradiol (ESTRACE) 0.5 MG tablet  12/28/17   [provider]  fluticasone  (FLONASE ) 50 MCG/ACT nasal spray Place 2 sprays into both nostrils daily. 05/10/24   Cathlene Marry Lenis, FNP  glucose blood (ACCU-CHEK GUIDE TEST) test strip test blood sugar daily DX E11.65 06/28/24   Lavell Bari A, FNP  meloxicam  (MOBIC ) 15 MG tablet Take 1 tablet (15 mg total) by mouth daily. 11/23/23   Lavell Bari LABOR, FNP  Multiple Vitamin (MULTIVITAMIN) tablet Take 1 tablet by mouth daily.    [provider]  omeprazole (PRILOSEC) 40 MG capsule 40 mg. 02/05/24   [provider]  pravastatin  (PRAVACHOL ) 40 MG tablet TABLET ONE TABLET AT BEDTIME 05/24/24   Hawks, Bari A, FNP  Semaglutide ,0.25 or 0.5MG /DOS, (OZEMPIC , 0.25 OR 0.5 MG/DOSE,) 2 MG/3ML SOPN Inject 0.5 mg into the skin once a week. Via novo nordisk PAP 08/22/24   Lavell Bari LABOR, FNP  Past Surgical History Past Surgical History:  Procedure Laterality Date   APPENDECTOMY  02/1988   CHOLECYSTECTOMY  02/1988   SHOULDER SURGERY Right    TONSILLECTOMY  1964   Family History Family History  Problem Relation Age of Onset   Heart disease Mother    Diabetes Mother    Heart attack Mother 42   Stroke Father    Cancer Brother        melanoma    Social History Social History   Tobacco Use   Smoking status: Former    Current packs/day: 0.00    Types: Cigarettes    Quit date: 07/06/1978    Years since quitting: 46.3   Smokeless tobacco: Never  Vaping Use   Vaping status: Never Used  Substance Use Topics   Alcohol use: No   Drug use: No   Allergies Sulfamethoxazole-trimethoprim, Celebrex [celecoxib], Indomethacin, Propoxyphene n-acetaminophen, Nickel, and Propoxyphene  Review of Systems Review of Systems  All other systems reviewed and are  negative.   Physical Exam Vital Signs  I have reviewed the triage vital signs BP 118/61   Pulse 79   Temp (!) 96.7 F (35.9 C) (Temporal)   Resp 15   Ht 5' 7 (1.702 m)   Wt 83.9 kg   SpO2 93%   BMI 28.98 kg/m  Physical Exam Vitals and nursing note reviewed.  Constitutional:      General: She is not in acute distress.    Appearance: She is well-developed.  HENT:     Head: Normocephalic and atraumatic.     Mouth/Throat:     Mouth: Mucous membranes are moist.  Eyes:     Pupils: Pupils are equal, round, and reactive to light.  Cardiovascular:     Rate and Rhythm: Normal rate and regular rhythm.     Heart sounds: No murmur heard. Pulmonary:     Effort: Pulmonary effort is normal. No respiratory distress.     Breath sounds: Normal breath sounds.  Abdominal:     General: Abdomen is flat.     Palpations: Abdomen is soft.     Tenderness: There is abdominal tenderness in the epigastric area.  Musculoskeletal:        General: No tenderness.     Right lower leg: No edema.     Left lower leg: No edema.  Skin:    General: Skin is warm and dry.  Neurological:     General: No focal deficit present.     Mental Status: She is alert. Mental status is at baseline.  Psychiatric:        Mood and Affect: Mood normal.        Behavior: Behavior normal.     ED Results and Treatments Labs (all labs ordered are listed, but only abnormal results are displayed) Labs Reviewed  CBC WITH DIFFERENTIAL/PLATELET - Abnormal; Notable for the following components:      Result Value   Lymphs Abs 4.7 (*)    All other components within normal limits  LIPASE, BLOOD - Abnormal; Notable for the following components:   Lipase 54 (*)    All other components within normal limits  POC OCCULT BLOOD, ED - Abnormal; Notable for the following components:   Fecal Occult Bld POSITIVE (*)    All other components within normal limits  COMPREHENSIVE METABOLIC PANEL WITH GFR  CBC WITH DIFFERENTIAL/PLATELET   I-STAT CHEM 8, ED  TYPE AND SCREEN  Radiology CT ABDOMEN PELVIS W CONTRAST Result Date: 11/11/2024 CLINICAL DATA:  Abdominal pain, acute, nonlocalized.  Bloody stools. EXAM: CT ABDOMEN AND PELVIS WITH CONTRAST TECHNIQUE: Multidetector CT imaging of the abdomen and pelvis was performed using the standard protocol following bolus administration of intravenous contrast. RADIATION DOSE REDUCTION: This exam was performed according to the departmental dose-optimization program which includes automated exposure control, adjustment of the mA and/or kV according to patient size and/or use of iterative reconstruction technique. CONTRAST:  OMNIPAQUE IOHEXOL 300 MG/ML  SOLN COMPARISON:  08/09/2018 FINDINGS: Lower chest: Scarring along the medial right lung base. Otherwise, the lung bases are clear. Hepatobiliary: Cholecystectomy. Mild intrahepatic biliary dilatation likely secondary to cholecystectomy. There may be a small amount of focal fat near the gallbladder fossa. Main portal venous system is patent. Pancreas: Mild stranding along the anterior aspect of the pancreas on images 15 and 16, sequence 10. Mild pancreatic inflammation cannot be excluded. Spleen: Normal in size without focal abnormality. Adrenals/Urinary Tract: Normal adrenal glands. No hydronephrosis. No suspicious renal lesions. Normal appearance of the urinary bladder. Stomach/Bowel: Normal stomach. No bowel dilatation or obstruction. No focal bowel inflammation. Evidence for an appendectomy. Moderate amount of stool in the colon. Small focus of high density in the right colon on image 34, sequence 2 does not significant change on the delayed images and bleeding in this area is thought to be unlikely. Vascular/Lymphatic: Aortic atherosclerosis. No enlarged abdominal or pelvic lymph nodes. Reproductive: Status post hysterectomy.  No adnexal masses. Other: Negative for free fluid.  Negative for free air. Musculoskeletal: Degenerative facet disease in lumbar spine. No acute bone abnormality. IMPRESSION: 1. Mild stranding along the anterior aspect of the pancreas. Findings are nonspecific but cannot exclude mild pancreatitis. 2. Cholecystectomy. Mild intrahepatic biliary dilatation is likely secondary to cholecystectomy. 3. Aortic Atherosclerosis (ICD10-I70.0). Electronically Signed   By: Juliene Balder M.D.   On: 11/11/2024 15:05    Pertinent labs & imaging results that were available during my care of the patient were reviewed by me and considered in my medical decision making (see MDM for details).  Medications Ordered in ED Medications  ondansetron  (ZOFRAN ) injection 4 mg (4 mg Intravenous Given 11/11/24 1302)  sodium chloride 0.9 % bolus 1,000 mL (0 mLs Intravenous Stopped 11/11/24 1358)  HYDROmorphone (DILAUDID) injection 0.5 mg (0.5 mg Intravenous Given 11/11/24 1303)  iohexol (OMNIPAQUE) 300 MG/ML solution 100 mL (100 mLs Intravenous Contrast Given 11/11/24 1400)                                                                                                                                     Procedures Procedures  (including critical care time)  Medical Decision Making / ED Course   MDM:  68 year old presenting with abdominal pain.  Patient overall well-appearing, physical examination without focal abnormality other than epigastric pain.  Will also plan to perform rectal exam.  Differential includes gastritis, GERD, peptic  ulcer disease, pancreatitis.  She reports history of prior cholecystectomy.  Given tenderness will obtain CT scan to evaluate for structural abnormality and will treat pain.  Hemoglobin is reassuring at a normal level.  Clinical Course as of 11/11/24 1546  Fri Nov 11, 2024  1543 CT scan shows mild pancreatitis. Lipase borderline elevated but given epigastric TTP suspect this cause of  symptoms. Given evidence of occult bleed sent repeat CBC. Signed out to Dr. Cleotilde pending this, re-assessment of pain  [WS]    Clinical Course User Index [WS] Francesca, Elsie CROME, MD     Additional history obtained: -Additional history obtained from spouse -External records from outside source obtained and reviewed including: Chart review including previous notes, labs, imaging, consultation notes including prior notes    Lab Tests: -I ordered, reviewed, and interpreted labs.   The pertinent results include:   Labs Reviewed  CBC WITH DIFFERENTIAL/PLATELET - Abnormal; Notable for the following components:      Result Value   Lymphs Abs 4.7 (*)    All other components within normal limits  LIPASE, BLOOD - Abnormal; Notable for the following components:   Lipase 54 (*)    All other components within normal limits  POC OCCULT BLOOD, ED - Abnormal; Notable for the following components:   Fecal Occult Bld POSITIVE (*)    All other components within normal limits  COMPREHENSIVE METABOLIC PANEL WITH GFR  CBC WITH DIFFERENTIAL/PLATELET  I-STAT CHEM 8, ED  TYPE AND SCREEN    Notable for + fobt  Imaging Studies ordered: I ordered imaging studies including CT abdomen On my interpretation imaging demonstrates pancreatitis  I independently visualized and interpreted imaging. I agree with the radiologist interpretation   Medicines ordered and prescription drug management: Meds ordered this encounter  Medications   ondansetron  (ZOFRAN ) injection 4 mg   sodium chloride  0.9 % bolus 1,000 mL   HYDROmorphone  (DILAUDID ) injection 0.5 mg   iohexol  (OMNIPAQUE ) 300 MG/ML solution 100 mL    -I have reviewed the patients home medicines and have made adjustments as needed   Social Determinants of Health:  Diagnosis or treatment significantly limited by social determinants of health: obesity   Reevaluation: After the interventions noted above, I reevaluated the patient and found that  their symptoms have improved  Co morbidities that complicate the patient evaluation  Past Medical History:  Diagnosis Date   Arthritis    Right knee   GERD (gastroesophageal reflux disease)    Hyperlipidemia    Neuromuscular disorder (HCC)    Right knee meniscus tear   Vitamin D  deficiency    Warts       Dispostion: Disposition decision including need for hospitalization was considered, and patient disposition pending at time of sign out.    Final Clinical Impression(s) / ED Diagnoses Final diagnoses:  Acute pancreatitis without infection or necrosis, unspecified pancreatitis type  UGIB (upper gastrointestinal bleed)     This chart was dictated using voice recognition software.  Despite best efforts to proofread,  errors can occur which can change the documentation meaning.    Francesca Elsie CROME, MD 11/11/24 [phone]

## 2024-11-11 NOTE — ED Triage Notes (Signed)
 Pt with upper mid abd pain since Sunday. Black and bright red blood stools started on Monday. Denies emesis, + nausea. +bloated

## 2024-11-11 NOTE — Discharge Instructions (Addendum)
 All of your testing has been reassuring except for the CT scan which showed mild pancreatitis.  For this you can take pain medications for severe pain, I have prescribed some medicine called Percocet which you can take 1 tablet every 6 hours as needed only for severe pain, this may make you nauseated or constipated.  I would definitely try Tylenol or ibuprofen first.  Drink plenty of clear liquids and avoid eating any food for 24 hours.  Then slowly introduce food back and your appetite and your diet.  Please talk to your GI doctor about the blood in your stool or the dark-colored stools, this will need to be followed up within the next week as well, ER for worsening bleeding or black stools or abdominal pain  ER for severe worsening symptoms  Thank you for allowing us  to treat you in the emergency department today.  After reviewing your examination and potential testing that was done it appears that you are safe to go home.  I would like for you to follow-up with your doctor within the next several days, have them obtain your records and follow-up with them to review all potential tests and results from your visit.  If you should develop severe or worsening symptoms return to the emergency department immediately

## 2024-11-11 NOTE — ED Provider Notes (Signed)
 I followed up the patient's labs, there was no change in her CBC, hemoglobin is completely normal and stable.  Patient's vital signs been normal, she was informed of her diagnosis of mild pancreatitis, some pain medicine was given for home, she can follow-up outpatient   Lori Rogue, MD 11/11/24 1839

## 2024-11-22 ENCOUNTER — Ambulatory Visit: Payer: Self-pay | Admitting: Family

## 2024-11-25 ENCOUNTER — Ambulatory Visit: Payer: Self-pay | Admitting: Family

## 2024-11-25 ENCOUNTER — Encounter: Payer: Self-pay | Admitting: Family

## 2024-11-25 VITALS — BP 119/52 | HR 65 | Temp 97.3°F | Ht 67.0 in | Wt 189.0 lb

## 2024-11-25 DIAGNOSIS — E1165 Type 2 diabetes mellitus with hyperglycemia: Secondary | ICD-10-CM

## 2024-11-25 DIAGNOSIS — E785 Hyperlipidemia, unspecified: Secondary | ICD-10-CM

## 2024-11-25 DIAGNOSIS — K859 Acute pancreatitis without necrosis or infection, unspecified: Secondary | ICD-10-CM | POA: Diagnosis not present

## 2024-11-25 DIAGNOSIS — E669 Obesity, unspecified: Secondary | ICD-10-CM

## 2024-11-25 DIAGNOSIS — M1711 Unilateral primary osteoarthritis, right knee: Secondary | ICD-10-CM | POA: Diagnosis not present

## 2024-11-25 DIAGNOSIS — Z09 Encounter for follow-up examination after completed treatment for conditions other than malignant neoplasm: Secondary | ICD-10-CM

## 2024-11-25 DIAGNOSIS — E119 Type 2 diabetes mellitus without complications: Secondary | ICD-10-CM

## 2024-11-25 DIAGNOSIS — E559 Vitamin D deficiency, unspecified: Secondary | ICD-10-CM

## 2024-11-25 DIAGNOSIS — Z7985 Long-term (current) use of injectable non-insulin antidiabetic drugs: Secondary | ICD-10-CM | POA: Diagnosis not present

## 2024-11-25 LAB — CBC WITH DIFFERENTIAL/PLATELET
Basophils Absolute: 0.1 x10E3/uL (ref 0.0–0.2)
Basos: 1 %
EOS (ABSOLUTE): 0.2 x10E3/uL (ref 0.0–0.4)
Eos: 3 %
Hematocrit: 38 % (ref 34.0–46.6)
Hemoglobin: 13.1 g/dL (ref 11.1–15.9)
Immature Grans (Abs): 0 x10E3/uL (ref 0.0–0.1)
Immature Granulocytes: 0 %
Lymphocytes Absolute: 3.4 x10E3/uL — ABNORMAL HIGH (ref 0.7–3.1)
Lymphs: 41 %
MCH: 32.6 pg (ref 26.6–33.0)
MCHC: 34.5 g/dL (ref 31.5–35.7)
MCV: 95 fL (ref 79–97)
Monocytes Absolute: 0.8 x10E3/uL (ref 0.1–0.9)
Monocytes: 10 %
Neutrophils Absolute: 3.7 x10E3/uL (ref 1.4–7.0)
Neutrophils: 45 %
Platelets: 346 x10E3/uL (ref 150–450)
RBC: 4.02 x10E6/uL (ref 3.77–5.28)
RDW: 12.7 % (ref 11.7–15.4)
WBC: 8.2 x10E3/uL (ref 3.4–10.8)

## 2024-11-25 LAB — BAYER DCA HB A1C WAIVED: HB A1C (BAYER DCA - WAIVED): 5.4 % (ref 4.8–5.6)

## 2024-11-25 MED ORDER — MELOXICAM 15 MG PO TABS: 15.0000 mg | ORAL_TABLET | Freq: Every day | ORAL | 2 refills | Status: AC

## 2024-11-25 MED ORDER — CLOBETASOL PROPIONATE 0.05 % EX CREA: 1.0000 | TOPICAL_CREAM | Freq: Two times a day (BID) | CUTANEOUS | 5 refills | Status: AC

## 2024-11-25 NOTE — Patient Instructions (Signed)
Acute Pancreatitis  Acute pancreatitis happens when a gland called the pancreas suddenly develops inflammation, making it irritated and swollen. The pancreas is found on the left side of the abdomen, behind the stomach. The pancreas makes proteins (enzymes) that help to digest food. It also releases the hormones glucagon and insulin. These help to regulate blood sugar. Most sudden (acute) attacks of this condition last a few days and can cause serious problems. Some people become dehydrated and develop low blood pressure. In severe cases, bleeding in the abdomen can lead to shock and can be life-threatening. The lungs, heart, and kidneys may stop working. What are the causes? This condition may be caused by: Heavy alcohol use. Drug use. Gallstones or other conditions that can block the tube that drains the pancreas (pancreatic duct). A tumor in the pancreas. Other causes include: Being exposed to certain medicines or certain chemicals. Having health conditions such as diabetes, high triglycerides, or high calcium levels in your blood. High calcium levels are usually caused by the parathyroid gland being too active. An infection in the pancreas. Damage caused by an accident (trauma) or by the poison (venom) of a scorpion sting. Abdominal surgery. Autoimmune pancreatitis. This is when the body's disease-fighting system (immune system) attacks the pancreas. Genes that are passed from parent to child (inherited). In some cases, the cause of this condition is not known. What are the signs or symptoms? Symptoms of this condition include: Pain in the upper abdomen that may spread (radiate) to the back. Pain may be severe and often worsens after you eat. A tender and swollen abdomen. Nausea and vomiting. Fever. How is this diagnosed? This condition may be diagnosed based on: A physical exam. Blood tests. These include an increased (elevated) level of lipase or amylase. Imaging tests, such as CT  scans, MRIs, or an ultrasound of the abdomen. How is this treated? Treatment for this condition often requires a hospital stay and may include: Pain medicine. IV fluids. Placing a tube in the stomach to remove stomach contents and to control vomiting (nasogastric tube, or NG tube). Not eating until vomiting has lessened. Treating any underlying conditions that may be the cause. Treatment may include: Antibiotic medicines, if your condition is caused by an infection. Steroid medicine, if your condition is caused by your immune system attacking your pancreas (autoimmune disease). Surgery on the gallbladder or pancreas, if your condition is caused by gallstones or another blockage. Follow these instructions at home: Medicines Take over-the-counter and prescription medicines only as told by your health care provider. If you were prescribed an antibiotic medicine, take it as told by your health care provider. Do not stop using the antibiotic even if you start to feel better. Ask your health care provider if the medicine prescribed to you: Requires you to avoid driving or using machinery. Can cause constipation. You may need to take these actions to prevent or treat constipation: Take over-the-counter or prescription medicines. Eat foods that are high in fiber, such as beans, whole grains, and fresh fruits and vegetables. Limit foods that are high in fat and processed sugars, such as fried or sweet foods. Eating and drinking  Follow instructions from your health care provider about diet. This may involve avoiding alcohol and having less fat in your diet. Eat smaller, more frequent meals. Doing this causes the pancreas to make less digestive fluid. Drink enough fluid to keep your urine pale yellow. Do not drink alcohol if it caused your condition. General instructions Do not use  any products that contain nicotine or tobacco. These products include cigarettes, chewing tobacco, and vaping devices,  such as e-cigarettes. If you need help quitting, ask your health care provider. Get plenty of rest. If directed, check your blood sugar at home as told by your health care provider. Keep all follow-up visits. This is important. Contact a health care provider if: You do not get better as fast as expected. Your symptoms get worse or you get new symptoms. You keep having pain, weakness, or nausea. You get better and then pain comes back. You have a fever. Get help right away if: You vomit every time you eat or drink. Your pain becomes severe. Your skin or the white parts of your eyes turn yellow (jaundice). You have sudden swelling in your abdomen. You feel dizzy or you faint. Your blood sugar is high (over 300 mg/dL). You vomit blood. These symptoms may be an emergency. Get help right away. Call 911. Do not wait to see if the symptoms will go away. Do not drive yourself to the hospital. Summary Acute pancreatitis happens when inflammation of the pancreas suddenly occurs and the pancreas becomes irritated and swollen. This condition is typically caused by heavy alcohol use, drug use, or gallstones. Treatment for this condition usually requires a stay in the hospital. This information is not intended to replace advice given to you by your health care provider. Make sure you discuss any questions you have with your health care provider. Document Revised: 10/22/2021 Document Reviewed: 10/22/2021 Elsevier Patient Education  2024 [employer].

## 2024-11-25 NOTE — Progress Notes (Signed)
 Subjective:    Patient ID: Lori Livingston, female    DOB: 05-15-1956, 68 y.o.   MRN: [id number]  Chief Complaint  Patient presents with   Medical Management of Chronic Issues   PT presents to the office today for chronic follow up.   PT is followed by gynecologists every two years. Pt is on Estrace after her hysterectomy in 2010 for hot flashes.   Pt is followed by Ortho for bilateral knee pain as needed. She had a right knee replaced in  2019.   She reports she retired May 2022.   She is a diabetic and was taking Ozempic . Her starting weight is 192 lb. She had pancreatitis on 11/11/24 and stopped her Ozempic . Followed by GI.      11/25/2024    8:09 AM 11/11/2024   11:56 AM 08/22/2024    8:31 AM  Last 3 Weights  Weight (lbs) 189 lb 185 lb 189 lb 12.8 oz  Weight (kg) 85.73 kg 83.915 kg 86.093 kg     Arthritis Presents for follow-up visit. She complains of pain and stiffness. Affected locations include the left knee, right knee, right shoulder, left shoulder, left foot and right foot. Her pain is at a severity of 3/10.  Diabetes She presents for her follow-up diabetic visit. She has type 2 diabetes mellitus. Pertinent negatives for diabetes include no blurred vision and no foot paresthesias. Risk factors for coronary artery disease include dyslipidemia, diabetes mellitus, hypertension and sedentary lifestyle. She is following a generally healthy diet. Her overall blood glucose range is 110-130 mg/dl. Eye exam is current.  Hyperlipidemia This is a chronic problem. The current episode started more than 1 year ago. The problem is controlled. Recent lipid tests were reviewed and are normal. Exacerbating diseases include obesity. Current antihyperlipidemic treatment includes statins. The current treatment provides moderate improvement of lipids. Risk factors for coronary artery disease include dyslipidemia, hypertension and a sedentary lifestyle.  Gastroesophageal Reflux She complains of  belching and heartburn. This is a chronic problem. The current episode started more than 1 year ago. The problem occurs occasionally. The symptoms are aggravated by certain foods. She has tried a PPI for the symptoms. The treatment provided moderate relief.      Review of Systems  Eyes:  Negative for blurred vision.  Gastrointestinal:  Positive for heartburn.  Musculoskeletal:  Positive for stiffness.  All other systems reviewed and are negative.  Family History  Problem Relation Age of Onset   Heart disease Mother    Diabetes Mother    Heart attack Mother 52   Stroke Father    Cancer Brother        melanoma   Social History   Socioeconomic History   Marital status: Married    Spouse name: Not on file   Number of children: Not on file   Years of education: Not on file   Highest education level: Not on file  Occupational History   Not on file  Tobacco Use   Smoking status: Former    Current packs/day: 0.00    Types: Cigarettes    Quit date: 07/06/1978    Years since quitting: 46.4   Smokeless tobacco: Never  Vaping Use   Vaping status: Never Used  Substance and Sexual Activity   Alcohol use: No   Drug use: No   Sexual activity: Not on file  Other Topics Concern   Not on file  Social History Narrative   Not on file   Social  Drivers of Health   Tobacco Use: Medium Risk (11/25/2024)   Patient History    Smoking Tobacco Use: Former    Smokeless Tobacco Use: Never    Passive Exposure: Not on file  Financial Resource Strain: Low Risk (08/09/2024)   Overall Financial Resource Strain (CARDIA)    Difficulty of Paying Living Expenses: Not hard at all  Food Insecurity: No Food Insecurity (08/09/2024)   Epic    Worried About Radiation Protection Practitioner of Food in the Last Year: Never true    Ran Out of Food in the Last Year: Never true  Transportation Needs: No Transportation Needs (08/09/2024)   Epic    Lack of Transportation (Medical): No    Lack of Transportation (Non-Medical): No   Physical Activity: Insufficiently Active (08/09/2024)   Exercise Vital Sign    Days of Exercise per Week: 3 days    Minutes of Exercise per Session: 30 min  Stress: No Stress Concern Present (08/09/2024)   [employer] of Occupational Health - Occupational Stress Questionnaire    Feeling of Stress: Not at all  Social Connections: Socially Integrated (08/09/2024)   Social Connection and Isolation Panel    Frequency of Communication with Friends and Family: More than three times a week    Frequency of Social Gatherings with Friends and Family: More than three times a week    Attends Religious Services: More than 4 times per year    Active Member of Clubs or Organizations: Yes    Attends Banker Meetings: 1 to 4 times per year    Marital Status: Married  Depression (PHQ2-9): Low Risk (11/25/2024)   Depression (PHQ2-9)    PHQ-2 Score: 0  Alcohol Screen: Low Risk (08/09/2024)   Alcohol Screen    Last Alcohol Screening Score (AUDIT): 0  Housing: Unknown (08/09/2024)   Epic    Unable to Pay for Housing in the Last Year: No    Number of Times Moved in the Last Year: Not on file    Homeless in the Last Year: No  Utilities: Not At Risk (08/09/2024)   Epic    Threatened with loss of utilities: No  Health Literacy: Adequate Health Literacy (08/09/2024)   B1300 Health Literacy    Frequency of need for help with medical instructions: Never       Objective:   Physical Exam Vitals reviewed.  Constitutional:      General: She is not in acute distress.    Appearance: She is well-developed.  HENT:     Head: Normocephalic and atraumatic.     Right Ear: Tympanic membrane normal.     Left Ear: Tympanic membrane normal.  Eyes:     Pupils: Pupils are equal, round, and reactive to light.  Neck:     Thyroid : No thyromegaly.  Cardiovascular:     Rate and Rhythm: Normal rate and regular rhythm.     Heart sounds: Normal heart sounds. No murmur heard. Pulmonary:     Effort:  Pulmonary effort is normal. No respiratory distress.     Breath sounds: Normal breath sounds. No wheezing.  Abdominal:     General: Bowel sounds are normal. There is no distension.     Palpations: Abdomen is soft.     Tenderness: There is abdominal tenderness (mild RUQ).  Musculoskeletal:        General: No tenderness. Normal range of motion.     Cervical back: Normal range of motion and neck supple.  Skin:    General: Skin  is warm and dry.  Neurological:     Mental Status: She is alert and oriented to person, place, and time.     Cranial Nerves: No cranial nerve deficit.     Deep Tendon Reflexes: Reflexes are normal and symmetric.  Psychiatric:        Behavior: Behavior normal.        Thought Content: Thought content normal.        Judgment: Judgment normal.      BP (!) 119/52   Pulse 65   Temp (!) 97.3 F (36.3 C)   Ht 5' 7 (1.702 m)   Wt 189 lb (85.7 kg)   SpO2 95%   BMI 29.60 kg/m      Assessment & Plan:  KEYONIA GLUTH comes in today with chief complaint of Medical Management of Chronic Issues   Diagnosis and orders addressed:  1. Arthritis of knee, right - meloxicam  (MOBIC ) 15 MG tablet; Take 1 tablet (15 mg total) by mouth daily.  Dispense: 90 tablet; Refill: 2 - CMP14+EGFR  3. Type 2 diabetes mellitus with hyperglycemia, without long-term current use of insulin (HCC) (Primary) - CMP14+EGFR Will stop Ozempic . Has not taken in 3 weeks since acute pancreatitis. Her glucose is running 110-120. Does not want to start medication at this time.  Continue low carb diet  Follow up in 2 months. If glucose continues to rise will need to start another medication.   4. Hyperlipidemia, unspecified hyperlipidemia type - CMP14+EGFR  5. Vitamin D  deficiency - CMP14+EGFR  6. Obesity (BMI 30-39.9) - CMP14+EGFR  7. Hospital discharge follow-up Hospital notes reviewed  8. Acute pancreatitis, unspecified complication status, unspecified pancreatitis type Keep follow  up with GI.     Labs pending Keep follow up with GI Continue current medications  Health Maintenance reviewed Diet and exercise encouraged  Follow up plan: 2 months for DM follow up.    Bari Learn, FNP

## 2024-11-28 ENCOUNTER — Ambulatory Visit: Payer: Self-pay | Admitting: Family

## 2024-11-28 ENCOUNTER — Other Ambulatory Visit (HOSPITAL_COMMUNITY): Payer: Self-pay

## 2025-01-27 ENCOUNTER — Ambulatory Visit: Admitting: Family

## 2025-08-10 ENCOUNTER — Ambulatory Visit: Payer: Self-pay
